# Patient Record
Sex: Female | Born: 1970
Health system: Southern US, Community
[De-identification: ages and names within clinical notes are randomized; demographics above are authoritative.]

## PROBLEM LIST (undated history)

## (undated) DIAGNOSIS — E119 Type 2 diabetes mellitus without complications: Secondary | ICD-10-CM

## (undated) DIAGNOSIS — I2510 Atherosclerotic heart disease of native coronary artery without angina pectoris: Secondary | ICD-10-CM

## (undated) DIAGNOSIS — I499 Cardiac arrhythmia, unspecified: Secondary | ICD-10-CM

## (undated) DIAGNOSIS — I10 Essential (primary) hypertension: Secondary | ICD-10-CM

## (undated) HISTORY — PX: BREAST BIOPSY: SHX20

## (undated) HISTORY — PX: CARDIAC ELECTROPHYSIOLOGY STUDY AND ABLATION: SHX1294

---

## 1999-01-19 ENCOUNTER — Emergency Department (HOSPITAL_COMMUNITY): Admission: EM | Admit: 1999-01-19 | Discharge: 1999-01-19 | Payer: Self-pay | Admitting: Emergency Medicine

## 1999-12-05 ENCOUNTER — Encounter: Admission: RE | Admit: 1999-12-05 | Discharge: 2000-03-04 | Payer: Self-pay | Admitting: Obstetrics and Gynecology

## 2000-02-13 ENCOUNTER — Inpatient Hospital Stay (HOSPITAL_COMMUNITY): Admission: AD | Admit: 2000-02-13 | Discharge: 2000-02-17 | Payer: Self-pay | Admitting: Obstetrics and Gynecology

## 2000-03-25 ENCOUNTER — Other Ambulatory Visit: Admission: RE | Admit: 2000-03-25 | Discharge: 2000-03-25 | Payer: Self-pay | Admitting: Obstetrics and Gynecology

## 2001-01-12 ENCOUNTER — Emergency Department (HOSPITAL_COMMUNITY): Admission: EM | Admit: 2001-01-12 | Discharge: 2001-01-12 | Payer: Self-pay | Admitting: *Deleted

## 2001-01-12 ENCOUNTER — Encounter: Payer: Self-pay | Admitting: Emergency Medicine

## 2001-04-29 ENCOUNTER — Other Ambulatory Visit: Admission: RE | Admit: 2001-04-29 | Discharge: 2001-04-29 | Payer: Self-pay | Admitting: Obstetrics and Gynecology

## 2002-03-01 ENCOUNTER — Ambulatory Visit: Admission: RE | Admit: 2002-03-01 | Discharge: 2002-03-01 | Payer: Self-pay | Admitting: Emergency Medicine

## 2002-03-01 ENCOUNTER — Encounter: Payer: Self-pay | Admitting: Obstetrics and Gynecology

## 2002-04-15 ENCOUNTER — Ambulatory Visit (HOSPITAL_COMMUNITY): Admission: RE | Admit: 2002-04-15 | Discharge: 2002-04-15 | Payer: Self-pay | Admitting: *Deleted

## 2002-04-15 ENCOUNTER — Encounter: Payer: Self-pay | Admitting: *Deleted

## 2002-07-04 ENCOUNTER — Emergency Department (HOSPITAL_COMMUNITY): Admission: EM | Admit: 2002-07-04 | Discharge: 2002-07-04 | Payer: Self-pay | Admitting: Emergency Medicine

## 2002-10-05 ENCOUNTER — Other Ambulatory Visit: Admission: RE | Admit: 2002-10-05 | Discharge: 2002-10-05 | Payer: Self-pay | Admitting: Obstetrics and Gynecology

## 2003-02-23 ENCOUNTER — Encounter: Admission: RE | Admit: 2003-02-23 | Discharge: 2003-02-23 | Payer: Self-pay | Admitting: Obstetrics and Gynecology

## 2003-04-30 ENCOUNTER — Encounter (INDEPENDENT_AMBULATORY_CARE_PROVIDER_SITE_OTHER): Payer: Self-pay | Admitting: Specialist

## 2003-04-30 ENCOUNTER — Inpatient Hospital Stay (HOSPITAL_COMMUNITY): Admission: RE | Admit: 2003-04-30 | Discharge: 2003-05-02 | Payer: Self-pay | Admitting: Obstetrics and Gynecology

## 2003-06-08 ENCOUNTER — Other Ambulatory Visit: Admission: RE | Admit: 2003-06-08 | Discharge: 2003-06-08 | Payer: Self-pay | Admitting: Obstetrics and Gynecology

## 2003-08-23 ENCOUNTER — Emergency Department (HOSPITAL_COMMUNITY): Admission: EM | Admit: 2003-08-23 | Discharge: 2003-08-23 | Payer: Self-pay | Admitting: *Deleted

## 2005-01-29 ENCOUNTER — Emergency Department (HOSPITAL_COMMUNITY): Admission: EM | Admit: 2005-01-29 | Discharge: 2005-01-29 | Payer: Self-pay | Admitting: Emergency Medicine

## 2006-03-29 ENCOUNTER — Emergency Department (HOSPITAL_COMMUNITY): Admission: EM | Admit: 2006-03-29 | Discharge: 2006-03-29 | Payer: Self-pay | Admitting: Emergency Medicine

## 2007-05-03 ENCOUNTER — Emergency Department (HOSPITAL_COMMUNITY): Admission: EM | Admit: 2007-05-03 | Discharge: 2007-05-03 | Payer: Self-pay | Admitting: Emergency Medicine

## 2007-05-08 ENCOUNTER — Ambulatory Visit: Payer: Self-pay | Admitting: Cardiovascular Disease

## 2007-06-02 ENCOUNTER — Ambulatory Visit: Payer: Self-pay

## 2007-06-02 ENCOUNTER — Ambulatory Visit: Payer: Self-pay | Admitting: Cardiovascular Disease

## 2007-06-02 ENCOUNTER — Ambulatory Visit: Payer: Self-pay | Admitting: Internal Medicine

## 2007-06-02 ENCOUNTER — Encounter: Payer: Self-pay | Admitting: Cardiovascular Disease

## 2007-06-02 LAB — CONVERTED CEMR LAB
BUN: 12 mg/dL (ref 6–23)
Basophils Absolute: 0.1 10*3/uL (ref 0.0–0.1)
Basophils Relative: 1.2 % — ABNORMAL HIGH (ref 0.0–1.0)
CO2: 31 meq/L (ref 19–32)
Calcium: 9 mg/dL (ref 8.4–10.5)
Chloride: 106 meq/L (ref 96–112)
Creatinine, Ser: 0.8 mg/dL (ref 0.4–1.2)
Eosinophils Absolute: 0.2 10*3/uL (ref 0.0–0.7)
Eosinophils Relative: 1.9 % (ref 0.0–5.0)
GFR calc Af Amer: 104 mL/min
GFR calc non Af Amer: 86 mL/min
Glucose, Bld: 95 mg/dL (ref 70–99)
HCT: 40.4 % (ref 36.0–46.0)
Hemoglobin: 14 g/dL (ref 12.0–15.0)
INR: 0.9 (ref 0.8–1.0)
Lymphocytes Relative: 24.4 % (ref 12.0–46.0)
MCHC: 34.7 g/dL (ref 30.0–36.0)
MCV: 89.6 fL (ref 78.0–100.0)
Monocytes Absolute: 0.9 10*3/uL (ref 0.1–1.0)
Monocytes Relative: 7.3 % (ref 3.0–12.0)
Neutro Abs: 7.6 10*3/uL (ref 1.4–7.7)
Neutrophils Relative %: 65.2 % (ref 43.0–77.0)
Platelets: 251 10*3/uL (ref 150–400)
Potassium: 3.8 meq/L (ref 3.5–5.1)
Prothrombin Time: 11.4 s (ref 10.9–13.3)
RBC: 4.51 M/uL (ref 3.87–5.11)
RDW: 12.4 % (ref 11.5–14.6)
Sodium: 140 meq/L (ref 135–145)
WBC: 11.7 10*3/uL — ABNORMAL HIGH (ref 4.5–10.5)

## 2007-06-13 ENCOUNTER — Ambulatory Visit: Payer: Self-pay | Admitting: Internal Medicine

## 2007-06-13 ENCOUNTER — Ambulatory Visit (HOSPITAL_COMMUNITY): Admission: RE | Admit: 2007-06-13 | Discharge: 2007-06-14 | Payer: Self-pay | Admitting: Internal Medicine

## 2007-07-16 ENCOUNTER — Ambulatory Visit: Payer: Self-pay | Admitting: Internal Medicine

## 2010-05-30 NOTE — Assessment & Plan Note (Signed)
[facility]                            CARDIOLOGY OFFICE NOTE   NAME:Mansour, EMY                        MRN:          [id number]  DATE:05/08/2007                            DOB:          August 02, 1970    HISTORY:  Ms. Heaps is referred by Dr. Greta Doom in the ER at  [facility].  The patient is actually a Psychologist, sport and exercise at [employer] and  knows Dr. Colon Branch.  The patient has a history of WPW.  I do not have any  records on her.  She apparently was first diagnosed at age 3.  It  sounds like she was on Pronestyl for a year, but has otherwise been  maintained on beta blockers.  Since the age of 36, she has had about 3  episodes of palpitations, most recently she had one on Saturday.  She  understands about vagal maneuvers and she cannot break her tachycardia.   She came to the emergency room.  Again, I do not have any records.  It  is not clear to me whether she got more Pronestyl, Cardizem or a beta  blocker, but she eventually slowed her heart rate down and has not had  any recurrences since Saturday.  The patient does not have syncope.  She  has significant palpitations.  There is no associated chest pain,  diaphoresis or shortness of breath.   Again, the patient has very infrequent episodes.  In this particular  episode, she was under a little stress.  She was having a birthday party  for her 69-year-old son.  However in general, these episodes appear out  of the blue and are not related to exertion.  She has never had syncope.  She has never had chest pain.  As far as I can tell, she has not had any  previous workup including echo or stress test.   REVIEW OF SYSTEMS:  Remarkable for occasional migraines.   PAST MEDICAL HISTORY:  1. Remarkable for 2 previous C. sections.  2. Borderline diabetes.  3. High blood pressure.   ALLERGIES:  CODEINE.   MEDICATIONS:  1. She takes atenolol 50 b.i.d.  2. Hydrochlorothiazide 25 a day.  3. Sertraline  50 a day.   SOCIAL HISTORY:  She is happily married.  Her husband was with her.  They seem to get along great.  He is a paramedic.  They have 2 children,  a 69-year-old and a 41-year-old.  She is otherwise not extremely active in  terms of exercise.   FAMILY HISTORY:  Both mother and father are alive without premature  coronary disease.   PHYSICAL EXAMINATION:  GENERAL:  Remarkable for a slightly overweight  white female in no distress.  VITAL SIGNS:  Weight is 229, blood pressure is 120/70, pulse is 80 and  regular, afebrile, respiratory rate 14.  HEENT:  Unremarkable.  NECK:  Carotids are normal without bruit, no lymphadenopathy,  thyromegaly or JVP elevation.  LUNGS:  Clear with diaphragmatic motion.  No wheezing.  HEART:  S1-S2 with normal heart sounds.  PMI normal.  ABDOMEN:  Benign.  Bowel sounds are positive.  No AAA, no tenderness, no  hepatosplenomegaly.  No hepatojugular reflux.  EXTREMITIES:  Distal pulses are intact, no edema.  NEURO:  Nonfocal.  SKIN:  Warm and dry.  No muscular weakness.   DIAGNOSTICS:  I do not have an EKG yet on the patient.  We initially  tried to do one in the office, but she was covered with lotion.  Dictation is being done while the tech is trying to do an EKG.   IMPRESSION:  Wolfe-Parkinson-White syndrome.  Again, I will have to look  at the patient's EKG to see where her bypass tract is.  I will refer her  to EP in [place].  I think that it is reasonable for her to undergo  an ablation so long as it is not a difficult septal pathway.   PLAN:  1. This way here, she will not have to worry about prolonged      medications.  Since her episodes are infrequent, I do not think she      needs to be on Pronestyl again.  I would continue her beta-blocker.  2. Rule out structural heart disease in the setting of WPW.  I think      it is important to make sure she does not have other structural      disease including Epstein's anomaly.  She needs a  2-D      echocardiogram which will be ordered.  3. In terms of risk stratifying her, I will do a treadmill test on her      to see when her bypass track extinguishes.  We will try arrange the      echo and stress test on the same day that she sees EP in      [place].  4. Hypertension.  Continue atenolol 50 b.i.d. and low-dose diuretic.   I spent quite a bit of time talking to the patient and her husband  regarding the diagnosis of WPW, associated symptoms and treatment  options.  She is knowledgeable being a Psychologist, sport and exercise.  She seems interested  in seeing the electrophysiology doctor.     Noralyn Pick. Eden Emms, MD, [facility]  Electronically Signed    PCN/MedQ  DD: 05/08/2007  DT: 05/08/2007  Job #: [id number]

## 2010-05-30 NOTE — Op Note (Signed)
NAME:  [name] [name], [name] [name]               ACCOUNT NO.:  [id number]   MEDICAL RECORD NO.:  [id number]          PATIENT TYPE:  OIB   LOCATION:  4739                         FACILITY:  [facility]   PHYSICIAN:  Doylene Canning. 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   INTRODUCTION:  The patient is a 40 year old woman with a longstanding  history of tachy palpitations dating back to high school.  She was  initially diagnosed with WPW syndrome and was placed on Pronestyl for  1 year, but this was discontinued.  The patient's episodes of SVT have  been fairly infrequent, but at her daughter's birth day party several  weeks ago she had an episode of very rapid heart racing, which came on  suddenly, was associated with shortness of breath and chest pressure.  Her pulse was too rapid to count according to her husband who is a  Art therapist, and in transit to the emergency room, eventually  the heart racing stopped.  The patient has been on beta-blockers for  hypertension and her tachy palpitations had been fairly infrequent, but  symptoms had been very severe and she is now referred for catheter  ablation.   PROCEDURE:  After informed was obtained, the patient was taken  diagnostic EP lab in a fasting state.  After usual preparation and  draping, intravenous fentanyl and midazolam was given for sedation.  A 6-  [place] hexapolar catheter was inserted percutaneously into the right  jugular vein and advanced to coronary sinus.  A 5-French quadripolar  catheter was inserted percutaneously in the right femoral vein and  advanced to RV apex.  A 5-0 French quadripolar catheter was inserted  percutaneously in the right femoral vein and advanced to His bundle  region.  After measurement of basic intervals,  rapid ventricular pacing  was carried out from the RV apex at 600 milliseconds and stepwise  decreased down to 290 milliseconds where VA Wenckebach was observed.  During rapid ventricular pacing, the atrial activation sequence was  midline and decremental.  Next, programmed ventricular stimulation was  carried out from the RV apex at base drive cycle length of 295  milliseconds.  The S1-S2 interval stepwise decreased down to 20  milliseconds where retrograde AV node ERP was observed.  During  programmed ventricular stimulation, there was nonsustained SVT with  midline atrial activation.  This could not be sustained, however.  Next,  programmed atrial stimulation was carried out from the coronary sinus  and the high right atrial pacing cycle length of 600 milliseconds and  stepwise decreased down to 330 milliseconds where AV Wenckebach was  observed.  During rapid atrial pacing, the PR interval was equal to but  not greater than the RR interval and there was no inducible SVT.  Next,  programmed atrial stimulation was carried out from the coronary sinus  and high right atrium base drive cycle length of  500 milliseconds.  The  S1-S2 interval was stepwise decreased from 440 milliseconds down to 280  milliseconds where the AV node ERP was observed.  During programmed  atrial stimulation, there were AH jumps and echo beats, but no inducible  SVT.  At this point, isoproterenol was infused at rates between 1 and 2  mcg per minute.  Additional rapid atrial and ventricular pacing as well  as programmed atrial and ventricular stimulation were carried out.  During programmed ventricular stimulation, there was again nonsustained  AV node reentry tachycardia cycle length of around 330-340 milliseconds.  However, these were nonsustained and despite multiple pacing attempts  could not be initiated and sustained.  At this point, we had to make a  decision about whether to proceed with a period of watchful  waiting or  whether we should proceed with a slow pathway modification of the  patient's arrhythmias.  Because she has had very symptomatic SVT in the  past despite medical therapy, the decision was made to proceed with slow  pathway modification despite the relative uncertainty that AVNRT was her  actual clinical diagnosis.  However, with the clinical history as well  as with her finding of nonsustained AVNRT jumps and echo beats.  It was  deemed most appropriate to proceed at this point.  The 7-French  quadripolar ablation catheter was inserted percutaneously into the right  femoral vein and advanced into Koch's triangle.  Koch's triangle was  displaced anteriorly.  It was somewhat larger than usual.  Initial RF  energy application delivered at the floor of Koch's triangle did not  result in junctional rhythm.  The ablation catheter was subsequently  maneuvered back to site between Koch's triangle site 5 and 6 and in this  location, junctional rhythm was demonstrated.  At this point, additional  rapid atrial and ventricular pacing and programmed atrial ventricular  stimulation were carried out and there was no additional inducible SVT,  although the patient did have intermittent echo beats present without a  jump.  At this point, the catheters were removed.  Hemostasis was  assured and the patient was returned to her room in satisfactory  condition.   COMPLICATIONS:  There were no immediate procedure complications.   RESULTS:  1. Baseline ECG.  The base ECG demonstrated sinus rhythm with normal      axis intervals.  2. Baseline intervals.  The HV interval was 35 milliseconds.  The QRS      duration was 90 milliseconds.  The sinus node cycle length was 700      milliseconds.  3. Rapid ventricular pacing.  Rapid ventricle pacing was carried out      from the RV apex demonstrating midline decremental VA conduction.      The VA Wenckebach cycle length was 290.  4. Programmed  ventricular stimulation.  Programmed ventricular      stimulation was carried out from the RV apex at base drive cycle      length of 500 milliseconds.  The S1-S2 interval stepwise decreased      down to 220 milliseconds where the retrograde AV node ERP was      observed.  During programmed ventricular stimulation, the atrial      activation was midline and decremental.  5. Rapid atrial pacing.  Rapid atrial pacing was carried out from the      coronary sinus to high right atrium stepwise decreased down to 330      milliseconds where AV Wenckebach was  observed.  During rapid atrial      pacing, the PR interval was equal to greater but not greater than      the RR interval.  There was no inducible SVT.  6. Programmed atrial stimulation.  Programmed atrial stimulation was      carried out from the coronary sinus and high right atrium base      drive cycle length of 161 as well as 400 milliseconds.  The S1-S2      interval was stepwise decreased down to 280 milliseconds where the      AV node ERP was observed.  During programmed atrial stimulation,      there were AH jumps and echo beats noted, but there were no      inducible SVT.  7. Arrhythmias observed.  Nonsustained AV node reentry tachycardia.      Initiation was with programmed ventricular stimulation.  Duration      was nonsustained.  Termination was spontaneous.  Cycle length was      variable approximately 340 milliseconds.  The QRS morphology was      left bundle.  8. Mapping.  Mapping of Koch's triangle demonstrated somewhat larger      than usual Koch's triangle with the anterior displacement.  9. RF energy application.  A total of 2 RF energy applications were      delivered between sites 5 and 9 Koch's triangle resulting      accelerated junctional rhythm.   CONCLUSIONS:  The study demonstrates apparent successful  electrophysiologic study and catheter ablation of nonsustained AV node  reentry tachycardia in a patient with  clinical history of recurrent  tachy palpitations for many years.  The clinical diagnosis was under  some question because of the nonsustained and duration of the patient's  SVT.      Doylene Canning. Ladona Ridgel, MD  Electronically Signed     GWT/MEDQ  D:  06/13/2007  T:  06/14/2007  Job:  [id number]   cc:   Nicoletta Dress. Colon Branch, M.D.

## 2010-05-30 NOTE — Assessment & Plan Note (Signed)
[facility]                         ELECTROPHYSIOLOGY OFFICE NOTE   NAME:[name] [name], [name] [name]                        MRN:          [id number]  DATE:07/16/2007                            DOB:          November 01, 1970    Ms. Done returns today for followup.  She is a very pleasant 40-year-  old with a history of hypertension who has a history of SVT and  underwent electrophysiologic study and catheter ablation of her SVT back  in May.  She returns today for followup.  She has had no significant  heart racing or other symptoms since her ablation procedure was carried  out.  She does admit to some hypertension and was initially on the  atenolol and felt a little draggy, so she was switched to  hydrochlorothiazide and lisinopril.  She had no specific complaints  today.   MEDICATIONS:  1. Hydrochlorothiazide 25 a day.  2. Lisinopril 20 a day.  3. Topamax 100 a day.  4. Sertraline 50 a day.  5. Multiple vitamins.   PHYSICAL EXAMINATION:  GENERAL:  She is a pleasant well-appearing 89-  year-old woman in no distress.  VITAL SIGNS:  Blood pressure was 115/80, the pulse 72 and regular, the  respirations were 18, the weight was 234 pounds.  NECK:  Revealed no jugular venous distention.  LUNGS:  Clear bilaterally to auscultation.  No wheezes, rales, or  rhonchi are present.  CARDIAC:  Regular rate and rhythm.  Normal S1-S2.  EXTREMITIES:  Demonstrated no edema.   EKG demonstrates sinus rhythm with normal axis intervals.   IMPRESSION:  1. Symptomatic supraventricular tachycardia, status post catheter      ablation; no recurrent symptoms.  2. Hypertension, well controlled on combination of lisinopril and      hydrochlorothiazide.  3. Obesity.  I have discussed the importance of weight loss.   DISCUSSION:  Ms. Milewski is otherwise stable.  We will plan to see the  patient back in the office on a p.r.n. basis.     Doylene Canning. Ladona Ridgel, MD  Electronically  Signed   GWT/MedQ  DD: 07/16/2007  DT: 07/16/2007  Job #: [id number]   cc:   Nicoletta Dress. Colon Branch, M.D.

## 2010-05-30 NOTE — Assessment & Plan Note (Signed)
HEALTHCARE                         ELECTROPHYSIOLOGY OFFICE NOTE   NAME:[name] [name]                        MRN:          [id number]  DATE:06/02/2007                            DOB:          12/29/1970    Ms. [name] [name] is referred today by Dr. Charlton Haws for evaluation of  SVT.  The patient is very pleasant 40 year old woman.  She works as a  Academic librarian at [employer].  She has a history of  hypertension.  She states that at age 37 she was diagnosed with WPW  syndrome by a cardiologist in [place].  I do not know anything more about  this, and she does not know the doctor's name.  Apparently, she was  initially placed on Pronestyl but then switch to beta blockers and has  been fairly stable.  Since then, she has had 3 to 4 long episodes of SVT  requiring medical attention.  She also has other episodes which are very  infrequent and short lived.  The patient was with her daughter at her  daughter's birthday party several weeks ago when she went into SVT.  This persisted for over an hour.  The pulse was not palpable by her  husband who works as an Museum/gallery exhibitions officer.  She tried vagal maneuvers, and after over  an hour, the SVT eventually but then returned, and she subsequently went  to the emergency department were it stopped after that.  This was  despite taking her atenolol 50 mg twice a day.  The patient is now  referred for additional evaluation.  She denies any frank syncope.   FAMILY HISTORY:  Is negative for premature coronary disease.   SOCIAL HISTORY:  Is notable for her being married.  She denies tobacco  or ethanol abuse.   REVIEW OF SYSTEMS:  Is notable for palpitations and chest pressure when  she goes into SVT.  Otherwise, she has very mild arthritis and no other  pertinent findings on review of systems.   PHYSICAL EXAMINATION:  She is pleasant, obese, 40 year old woman in no  acute distress.  The blood pressure was 130/86, the pulse  96 and  regular, respirations were 18.  Weight was 235 pounds.  HEENT EXAM:  Was normocephalic and atraumatic.  Pupils equal and round.  Oropharynx moist.  Sclerae anicteric.  NECK:  Revealed no jugular venous distention.  There was no thyromegaly.  Trachea was midline.  The carotids were 2+ symmetric.  LUNGS:  Clear bilaterally to auscultation.  No wheezes, rales or rhonchi  were present.  There was no increased work of breathing.  CARDIOVASCULAR EXAM:  Revealed a regular rate and rhythm.  Normal S1-S2.  No murmurs, rubs or gallops appreciated.  The PMI was not enlarged nor  was it laterally displaced.  ABDOMINAL EXAM:  Was obese, nontender, nondistended.  There was no  organomegaly.  EXTREMITIES:  Demonstrated no cyanosis, clubbing or edema.  Pulses were  2+ and symmetric.  NEUROLOGIC EXAM:  Alert and oriented x3 with cranial nerves intact.  Strength was 5/5 and symmetric.   EKG from one  month  ago demonstrates sinus rhythm.  There was no  evidence of any ventricular pre-excitation on careful review of the EKG.  The PR interval was slightly shortened, however.   IMPRESSION:  1. Recurrent supraventricular tachycardia, possibly secondary to [employer]-      Parkinson-White syndrome, though there is no clear-cut diagnosis of      this.  This SVT os despite medical therapy.  2. Hypertension.  3. Obesity.   DISCUSSION:  I have discussed treatment options with the patient.  The  risks, benefits, goals, and expectations of electrophysiologic study and  catheter ablation of her SVT have been discussed, and she would like to  proceed.  This will be scheduled at earliest possible convenient time.     [name] Canning. Ladona Ridgel, MD  Electronically Signed    GWT/MedQ  DD: 06/02/2007  DT: 06/02/2007  Job #: [id number]   cc:   [name] [name]. [name] [name], M.D.

## 2010-05-30 NOTE — Procedures (Signed)
[facility]                              EXERCISE TREADMILL   NAME:[name] [name], [name] [name]                        MRN:          [id number]  DATE:06/02/2007                            DOB:          25-Jun-1970    INDICATIONS:  WPW.   The patient exercised for over 8 minutes on the same Bruce protocol and  stopped due to fatigue.  Maximum heart rate was 181,  peak blood  pressure is 135/74.   At baseline the patient had a short PR interval of 120 milliseconds.  There was minimal pre-excitation primarily seen in lead V4 with stress.  There was no change in morphology, no evidence of arrhythmia.   IMPRESSION:  Low risk stress test with minimal pre-excitation at rest,  no change in morphology with exercise up to heart rate of 181, no  evidence of arrhythmia.   The patient will follow with Dr. Sharrell Ku later today.     Noralyn Pick. Eden Emms, MD, [facility]  Electronically Signed    PCN/MedQ  DD: 06/02/2007  DT: 06/02/2007  Job #: [id number]

## 2010-05-30 NOTE — Discharge Summary (Signed)
NAME:  [name] [name], [name] [name]               ACCOUNT NO.:  [id number]   MEDICAL RECORD NO.:  [id number]          PATIENT TYPE:  OIB   LOCATION:  4739                         FACILITY:  [facility]   PHYSICIAN:  Doylene Canning. Ladona Ridgel, MD    DATE OF BIRTH:  January 04, 1971   DATE OF ADMISSION:  06/13/2007  DATE OF DISCHARGE:                               DISCHARGE SUMMARY   ALLERGIES:  The patient has food allergies.  Codeine gives hives.  Also  coconut and oranges make the patient break out and swell.   Time of this dictation and examination greater than 35 minutes.   FINAL DIAGNOSES:  1. 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  The patient had a small Koch's triangle.  2. Palpitations, increasing in frequency and duration.   SECONDARY DIAGNOSES:  1. Hypertension.  2. Treadmill study on Jun 02, 2007, minimal preexcitation noted.  3. Echocardiogram on Jun 02, 2007, preserved ejection fraction.  No      left ventricular wall motion abnormalities.   PROCEDURE:  Electrophysiology study, radiofrequency catheter ablation of  atrial ventricular nodal reentry tachycardia, Dr. Lewayne Bunting.  The  patient maintained sinus rhythm postoperatively.   BRIEF HISTORY:  [name] [name] is a 40 year old female.  She has a history  of hypertension.  She also has a history of tachy palpitations.   She was first diagnosed at age 30 and told that she had WPW.  Initially,  which she was placed on Pronestyl but then switched to beta blockers.  She has been fairly stable.   Lately, she has had 3 to 4 episodes of SVT.  She has had to seek medical  attention.  Some other episodes are infrequent and short-lived.  The  patient is unable to stop them with vagal maneuvers and they cause  palpitations and but no frank syncope and no chest pain.  The patient  was referred to electrophysiology for further evaluation.   The  electrocardiogram carefully investigated showing no evidence of  ventricular preexcitation.  The PR interval is slightly shortened.   The risks and benefits of catheter ablation and electrophysiology study  have been discussed with the patient.  She wishes to proceed.   HOSPITAL COURSE:  The patient presents electively on Jun 13, 2007.  She  underwent electrophysiology study.  The study uncovered AV nodal  reentry.  This was ablated with 2 radiofrequency applications to the  slow pathway.  The patient had a very small Koch's triangle.   DISCHARGE MEDICATIONS:  1. The patient's atenolol can be weaned slowly from 50 mg twice daily      to 25 mg twice daily and then stopped at follow up office visit.      The patient discharging on Jun 14, 2007, on atenolol doses.  2. Hydrochlorothiazide 25 mg daily.  3. Sertraline 50 mg daily .  4. Topamax 100 mg daily.  5. Vitamin B12 daily.  6. Multivitamin daily.   She sees Dr. Ladona Ridgel Wednesday on July 1  at 10:30.   LABORATORY STUDIES:  Pertinent to this admission drawn on Jun 02, 2007,  white cells 11.7, hemoglobin 14, hematocrit 40.4, and  platelets are to  251.  Protime 11.4, INR 0.9, sodium 140, potassium 3.8, chloride 106,  carbonate 31, glucose 95.  BUN is 12 and creatinine 0.8.      Maple Mirza, PA      Doylene Canning. Ladona Ridgel, MD  Electronically Signed    GM/MEDQ  D:  06/13/2007  T:  06/14/2007  Job:  [id number]   cc:   Doylene Canning. Ladona Ridgel, MD  Noralyn Pick Eden Emms, MD, [facility]  Nicoletta Dress. Colon Branch, M.D.

## 2010-06-02 NOTE — Op Note (Signed)
NAME:  [name] [name], [name] [name]                           ACCOUNT NO.:  [id number]   MEDICAL RECORD NO.:  [id number]                   PATIENT TYPE:  INP   LOCATION:  NA                                   FACILITY:  [facility]   PHYSICIAN:  Dineen Kid. Rana Snare, M.D.                 DATE OF BIRTH:  October 05, 1970   DATE OF PROCEDURE:  04/30/2003  DATE OF DISCHARGE:                                 OPERATIVE REPORT   PREOPERATIVE DIAGNOSES:  1. Intrauterine pregnancy at 39 weeks.  2. Previous cesarean section, for repeat.  3. Desired sterility.  4. Gestational diabetes requiring insulin.  5. Chronic hypertension.   POSTOPERATIVE DIAGNOSES:  1. Intrauterine pregnancy at 39 weeks.  2. Previous cesarean section, for repeat.  3. Desired sterility.  4. Gestational diabetes requiring insulin.  5. Chronic hypertension.   PROCEDURES:  1. Low segment transverse cesarean section.  2. [place] bilateral tubal ligation.   SURGEON:  Dineen Kid. Rana Snare, M.D.   ASSISTANT:  Duke Salvia. Marcelle Overlie, M.D.   ANESTHESIA:  Epidural.   ESTIMATED BLOOD LOSS:  800 mL.   INDICATIONS:  Ms. Greenwell is a 40 year old G2, P1, at 39 weeks, previous  cesarean section for cephalopelvic disproportion.  She presents for repeat  cesarean section and tubal ligation due to desired sterility.  Her pregnancy  was complicated by chronic hypertension, Wolff-Parkinson-White syndrome  which is asymptomatic, and also diabetes requiring insulin.  Risks and  benefits were discussed at length.  Informed consent was obtained was  obtained, including five out of 1000 tubal ligation failure.  She gives her  informed consent.   FINDINGS AT THE TIME OF SURGERY:  A viable female infant, Apgars are 8 and  9, pH arterial 7.34.   DESCRIPTION OF PROCEDURE:  After adequate analgesia in the supine position  with a left lateral tilt, she was sterilely prepped and draped, the bladder  was sterilely drained with a Foley catheter.  A Pfannenstiel skin incision  was  made two fingerbreadths above the pubic symphysis, taken down sharply to  the fascia, which was incised transversely and extended superiorly and  inferiorly off the bellies of the rectus muscle, which were separated  sharply in the midline.  The peritoneum was entered sharply.  Due to some  discomfort, 10 mL of 0.25% Marcaine was placed in the intraperitoneal  cavity.  A bladder flap was created and placed behind the bladder blade.  A  low segment myotomy incision was made down to the infant's vertex, extended  laterally with the operator's fingertips.  The vertex was then delivered,  nares and pharynx suctioned.  The infant was then delivered, cord clamped  and cut, handed to the pediatricians for resuscitation.  Cord blood was then  obtained, placenta extracted manually.  The uterus was exteriorized, wiped  clean with a dry lap.  The myotomy incision was closed in two layers, the  first being  a running locking layer of 0 Monocryl suture, the second being  an imbricating layer of 0 Monocryl suture with good approximation and good  hemostasis.  The right fallopian tube was identified by the fimbriated end  and the midportion of the tube is grasped with a Babcock clamp.  Two sutures  were placed of 0 plain through the mesosalpinx, doubly ligated, central  portion excised, and the proximal section was ligated with a 2-0 silk.  The  left fallopian tube was identified by the fimbriated end.  Two sutures of 0  plain were passed through the mesosalpinx, doubly ligated, central portion  excised, and the proximal section was then ligated with 2-0 silk.  The tubal  ostia were made hemostatic with Bovie cautery.  The uterus was replaced back  in the peritoneal cavity and after a copious amount of irrigation, adequate  hemostasis was assured.  The peritoneum is then closed with 0 Monocryl, the  rectus muscle plicated in the midline, irrigation is applied, and after  adequate hemostasis the fascia was  closed in a single layer of 0 PDS in  running fashion.  Irrigation was once again applied and after adequate  hemostasis, the skin was stapled and Steri-Strips applied.  The patient  tolerated the procedure well, was stable on transfer to the recovery room.  The sponge, needle, and instrument count was normal x3.  Estimated blood  loss 800 mL.  The patient received 1 g of Rocephin after delivery of the  placenta.                                               Dineen Kid Rana Snare, M.D.    DCL/MEDQ  D:  04/30/2003  T:  04/30/2003  Job:  [id number]

## 2010-06-02 NOTE — Discharge Summary (Signed)
[name] [name], [name] [name]                           ACCOUNT NO.:  [id number]   MEDICAL RECORD NO.:  [id number]                   PATIENT TYPE:  INP   LOCATION:  9146                                 FACILITY:  [facility]   PHYSICIAN:  Tracie Harrier, M.D.              DATE OF BIRTH:  06-07-1970   DATE OF ADMISSION:  04/30/2003  DATE OF DISCHARGE:  05/02/2003                                 DISCHARGE SUMMARY   ADMISSION DIAGNOSES:  1. Intrauterine pregnancy at 29 weeks estimated gestational age.  2. Previous cesarean section, for repeat.  3. Multiparity, desires sterility.  4. Gestational diabetes requiring insulin.  5. Chronic hypertension.   DISCHARGE DIAGNOSES:  1. Status post low transverse cesarean section.  2. A viable female infant.  3. Permanent sterilization.   PROCEDURE:  1. Repeat low transverse cesarean section.  2. [place] bilateral tubal ligation.   REASON FOR ADMISSION:  Please see dictated H&P.   HOSPITAL COURSE:  The patient was a 40 year old gravida 2, para 1 at 41  weeks estimated gestational age that was scheduled for a cesarean section.  The patient also had desired sterility.  On the morning of admission, the  patient was taken to the operating room where epidural was placed without  difficulty.  A low transverse incision was made with the delivery of a  viable female infant weighing 7 pounds 13 ounces with Apgars of 8 at one  minute and 9 at five minutes.  Umbilical cord pH was 7.34.  Bilateral tubal  ligation was performed without difficulty.  The patient was then transferred  to the recovery room in stable condition.  On postoperative day #1, the  patient was doing well.  She was without complaint, vital signs were stable,  she was afebrile, abdomen was soft, fundus was firm and nontender.  Abdominal dressing was clean, dry, and intact.  Labs revealed hemoglobin of  10.9.  On postoperative day #2, the patient was doing well.  She was  tolerating a regular diet  without complaints of nausea or vomiting, vital  signs were stable, she was afebrile.  Abdominal dressing had been removed  revealing an incision that was clean, dry, and intact.  The patient desired  early discharge and instructions were given and the patient was discharged  home.   CONDITION ON DISCHARGE:  Good.   DIET:  Regular as tolerated.   ACTIVITY:  No heavy lifting, no driving x2 weeks, no vaginal entry.   FOLLOW UP:  The patient is to follow up in the office in 2 to 3 days for a  staple removal.  She is to call for a temperature greater than 100 degrees,  persistent nausea and vomiting, heavy vaginal bleeding, and/or redness or  drainage from the incisional site.   DISCHARGE MEDICATIONS:  1. Percocet 5/325 mg, dispense #40, one 4-6 hours p.r.n. pain.  2. Motrin 600 mg every 6 hours p.r.n.  3. Prenatal vitamins one p.o. daily.  4. Insulin per pump as directed.     Julio Sicks, N.P.                        Tracie Harrier, M.D.    CC/MEDQ  D:  05/13/2003  T:  05/13/2003  Job:  [id number]

## 2010-06-02 NOTE — H&P (Signed)
NAME:  [name] [name], [name] [name]                           ACCOUNT NO.:  [id number]   MEDICAL RECORD NO.:  [id number]                   PATIENT TYPE:  INP   LOCATION:  NA                                   FACILITY:  [facility]   PHYSICIAN:  Dineen Kid. Rana Snare, M.D.                 DATE OF BIRTH:  1970-12-27   DATE OF ADMISSION:  04/30/2003  DATE OF DISCHARGE:                                HISTORY & PHYSICAL   HISTORY OF PRESENT ILLNESS:  [name] [name] is a 40 year old G2, P1 at [redacted]  weeks gestational age who presents for repeat cesarean section. Her  estimated date of confinement is May 05, 2003. The pregnancy has been  complicated by gestational diabetes requiring insulin. Also chronic  hypertension, on Aldomet and Atenolol. She does have a history of Evelene Croon-  Parkinson-White syndrome, which is asymptomatic and she has a history of  previous cesarean section for cephalopelvic disproportion. Desires repeat  cesarean section as well as tubal ligation.   PAST MEDICAL HISTORY:  Significant for chronic hypertension, Wolff-Parkinson-  White syndrome, and history of migraines.   PAST SURGICAL HISTORY:  Cesarean section for cephalopelvic disproportion.   MEDICATIONS:  Atenolol 50 mg at bedtime and Aldomet 500 mg t.i.d.   ALLERGIES:  CODEINE AND VICODIN (rash).   PHYSICAL EXAMINATION:  VITAL SIGNS:  Blood pressure 130/90.  HEART:  Regular rate and rhythm.  LUNGS:  Clear to auscultation bilaterally.  ABDOMEN:  Gravid, nontender.  GENITALIA:  Cervix is closed, thick, and high.   IMPRESSION:  Intrauterine pregnancy at 39 weeks. Previous cesarean section,  desires repeat cesarean section. Also desires sterility.   PLAN:  Risks and benefits were discussed at length which include, but not  limited to, risk of infection; bleeding; damage to the uterus, tubes,  ovaries, bowel, bladder or fetus. The patient understands the risk of tubal  failure rate is 5 out of 1,000. She does give her informed consent and  wishes  to proceed. Her CODEINE and VICODIN allergies are a rash.                                               Dineen Kid Rana Snare, M.D.    DCL/MEDQ  D:  04/29/2003  T:  04/29/2003  Job:  [id number]

## 2010-06-02 NOTE — Op Note (Signed)
[facility]  Patient:    [name] [name], [name] [name]                            MRN: [id number] Proc. Date: 02/14/00 Adm. Date:  [id number] Attending:  Frederich Balding                           Operative Report  PREOPERATIVE DIAGNOSIS:       Intrauterine pregnancy at 38 weeks with cephalopelvic disproportion.  POSTOPERATIVE DIAGNOSES:      1. Intrauterine pregnancy at 38 weeks with                                  cephalopelvic disproportion.                               2. Meconium.  PROCEDURE:                    Primary low transverse cesarean section.  SURGEON:                      Trevor Iha, M.D.  ANESTHESIA:                   Spinal.  ESTIMATED BLOOD LOSS:         800 cc.  INDICATIONS:                  The patient is a 40 year old G1, P0 whose pregnancy was complicated by gestational diabetes and chronic hypertension, who was admitted for induction of labor due to labile blood pressures and term pregnancy.  She underwent Cytotec induction and cervical ripening.  She progressed to 1 cm completely effaced.  However, the infants vertex never descended into the pelvis.  After 4-6 hours of Pitocin despite adequate contractions, the fetal vertex never descended into the pelvis.  Because of this, we proceed with primary low transverse cesarean section for cephalopelvic disproportion.  FINDINGS AT THE TIME OF SURGERY:              Viable female infant.  Apgars were 8 and 9. Cord pH is currently pending.  The weight was 8 lb 0 oz.  Light meconium was noted at the time of delivery.  DESCRIPTION OF PROCEDURE:     After adequate analgesia, the patient was placed in the supine position with left lateral tilt.  She was sterilely prepped and draped.  A Foley catheter was sterilely placed.  A Pfannenstiel skin incision was made two fingerbreadths above the pubic symphysis, where it was taken down sharply to the fascia, which was incised transversely and  then superiorly and inferiorly down to the belly of the rectus muscle.  They were separated sharply in the midline.  The peritoneum was entered sharply.  A bladder blade was placed.  The uterine serosa was elevated, nicked and incised transversely. A bladder flap was created and placed behind the bladder blade. A low cervical myotomy incision was made down to the amniotic sac.  Amniotomy was performed, revealing light meconium.  The infants vertex was then delivered atraumatically.  The nares and pharynx were then DeLee suctioned.  The infant was then delivered.  The cord was clamped.  The infant was handed to the  waiting pediatricians.  Good cry was noted.  Apgars were 8 and 9.  It was a female infant.  Cord blood was then obtained.  The placenta was extracted manually.  The uterus was exteriorized, wiped clean with a dry lap and closed in two layers, the first being a running locking layer of 0 Monocryl, the second being an imbricating later of 0 Monocryl, with good approximation and good hemostasis achieved.  Normal tubes and ovaries were noted as well as the uterus.  The uterus was placed back in the peritoneal cavity.  After a copious amount of irrigation and adequate hemostasis was assured, the peritoneum was closed with 0 Monocryl.  The fascia was then closed with 0 Panacryl and two sutures of 0 Monocryl in a running fasion, with good approximation and good hemostasis.  Irrigation was again done.  After adequate hemostasis, the skin was closed with staples and Steri-Strips applied.  The patient tolerated the procedure well and was stable on transfer to the recovery room.  Sponge and instrument count was normal x 3.  The patient received 1 g of cefotetan after delivery of the placenta.  Estimated blood loss for the procedure was 800 cc. DD:  02/14/00 TD:  02/14/00 Job: [id number]

## 2010-10-10 LAB — POCT I-STAT, CHEM 8
BUN: 15
Creatinine, Ser: 0.9
Hemoglobin: 13.9
Potassium: 3.5
Sodium: 135

## 2010-10-10 LAB — POCT CARDIAC MARKERS
CKMB, poc: <1 — ABNORMAL LOW
Myoglobin, poc: 63.2
Operator id: 282261
Troponin i, poc: <0.05

## 2010-10-11 LAB — BASIC METABOLIC PANEL
CO2: 28
Calcium: 8.5
GFR calc Af Amer: >60
GFR calc non Af Amer: >60
Glucose, Bld: 129 — ABNORMAL HIGH
Potassium: 3.4 — ABNORMAL LOW
Sodium: 139

## 2010-10-11 LAB — PROTIME-INR: INR: 0.9

## 2010-10-11 LAB — CBC
HCT: 38.3
Hemoglobin: 13.8
RBC: 4.28
RDW: 13

## 2010-10-11 LAB — APTT: aPTT: 32

## 2013-10-01 ENCOUNTER — Encounter (HOSPITAL_COMMUNITY): Payer: Self-pay | Admitting: Emergency Medicine

## 2013-10-01 ENCOUNTER — Emergency Department (HOSPITAL_COMMUNITY)
Admission: EM | Admit: 2013-10-01 | Discharge: 2013-10-01 | Disposition: A | Discharge status: Home / Self Care | Payer: 59 | Attending: Emergency Medicine | Admitting: Emergency Medicine

## 2013-10-01 ENCOUNTER — Emergency Department (HOSPITAL_COMMUNITY): Payer: 59

## 2013-10-01 DIAGNOSIS — I10 Essential (primary) hypertension: Secondary | ICD-10-CM | POA: Diagnosis not present

## 2013-10-01 DIAGNOSIS — Y9389 Activity, other specified: Secondary | ICD-10-CM | POA: Diagnosis not present

## 2013-10-01 DIAGNOSIS — S99919A Unspecified injury of unspecified ankle, initial encounter: Secondary | ICD-10-CM | POA: Diagnosis present

## 2013-10-01 DIAGNOSIS — S8251XA Displaced fracture of medial malleolus of right tibia, initial encounter for closed fracture: Secondary | ICD-10-CM

## 2013-10-01 DIAGNOSIS — S8990XA Unspecified injury of unspecified lower leg, initial encounter: Secondary | ICD-10-CM | POA: Diagnosis present

## 2013-10-01 DIAGNOSIS — R296 Repeated falls: Secondary | ICD-10-CM | POA: Insufficient documentation

## 2013-10-01 DIAGNOSIS — S8253XA Displaced fracture of medial malleolus of unspecified tibia, initial encounter for closed fracture: Secondary | ICD-10-CM | POA: Diagnosis not present

## 2013-10-01 DIAGNOSIS — I2510 Atherosclerotic heart disease of native coronary artery without angina pectoris: Secondary | ICD-10-CM | POA: Diagnosis not present

## 2013-10-01 DIAGNOSIS — S99929A Unspecified injury of unspecified foot, initial encounter: Secondary | ICD-10-CM

## 2013-10-01 DIAGNOSIS — Y9289 Other specified places as the place of occurrence of the external cause: Secondary | ICD-10-CM | POA: Insufficient documentation

## 2013-10-01 HISTORY — DX: Essential (primary) hypertension: I10

## 2013-10-01 HISTORY — DX: Cardiac arrhythmia, unspecified: I49.9

## 2013-10-01 HISTORY — DX: Atherosclerotic heart disease of native coronary artery without angina pectoris: I25.10

## 2013-10-01 MED ORDER — PROMETHAZINE HCL 12.5 MG PO TABS: 12.5000 mg | ORAL_TABLET | Freq: Four times a day (QID) | ORAL | Status: AC | PRN

## 2013-10-01 MED ORDER — OXYCODONE-ACETAMINOPHEN 5-325 MG PO TABS: 1.0000 | ORAL_TABLET | ORAL | Status: AC | PRN

## 2013-10-01 MED ORDER — PROMETHAZINE HCL 25 MG/ML IJ SOLN
25.0000 mg | Freq: Once | INTRAMUSCULAR | Status: AC
Start: 2013-10-01 — End: 2013-10-01
  Administered 2013-10-01: 25 mg via INTRAMUSCULAR
  Filled 2013-10-01: qty 1

## 2013-10-01 MED ORDER — HYDROMORPHONE HCL 1 MG/ML IJ SOLN
1.0000 mg | Freq: Once | INTRAMUSCULAR | Status: AC
  Administered 2013-10-01: 1 mg via INTRAMUSCULAR
  Filled 2013-10-01: qty 1

## 2013-10-01 MED ORDER — OXYCODONE-ACETAMINOPHEN 5-325 MG PO TABS
1.0000 | ORAL_TABLET | Freq: Once | ORAL | Status: AC
  Administered 2013-10-01: 1 via ORAL
  Filled 2013-10-01: qty 1

## 2013-10-01 NOTE — ED Notes (Signed)
Pt fell after stepping in hole approx 30 mins ago. Right ankle/right foot swollen. Warm, Pt able to wiggle toes, sensation in tact.

## 2013-10-01 NOTE — ED Provider Notes (Signed)
Seen and evaluated. History reviewed. Inverted, then everted her ankle. Complains of pain medially. Complains of pain and swelling laterally.  Tender palpation medial malleolus, and inferior to the lateral malleolus. Soft tissue swelling noted inferior to the lateral malleolus. Nontender over the fifth. Nontender over the forefoot. Nontender over the proximal fibula or tibia. X-rays show small osteophyte fracture of the tip of the medial malleolus. Posterior malleolus, and lateral malleolus appear intact. The foot is intact. Placed in posterior splint. Reexamined after he was placed. It is properly positioned. She requests orthopedic referral in [place]. Plan is nonweightbearing. Orthopedic followup. Diagnosis is medial malleolus fracture, probable anterior and inferior talofibular ligament tear.  Rolland Porter, MD 10/01/13 2005

## 2013-10-01 NOTE — ED Notes (Signed)
Discharge instructions reviewed with patient including need to see orthopedic surgeon as soon as possible and for patient to not return to work or weight bear on right ankle; pt verbalized understanding all all instructions. No crutches given patient stated she has a pair at home.

## 2013-10-01 NOTE — Discharge Instructions (Signed)
Cast or Splint Care °Casts and splints support injured limbs and keep bones from moving while they heal. It is important to care for your cast or splint at home.   °HOME CARE INSTRUCTIONS °· Keep the cast or splint uncovered during the drying period. It can take 24 to 48 hours to dry if it is made of plaster. A fiberglass cast will dry in less than 1 hour. °· Do not rest the cast on anything harder than a pillow for the first 24 hours. °· Do not put weight on your injured limb or apply pressure to the cast until your health care provider gives you permission. °· Keep the cast or splint dry. Wet casts or splints can lose their shape and may not support the limb as well. A wet cast that has lost its shape can also create harmful pressure on your skin when it dries. Also, wet skin can become infected. °¨ Cover the cast or splint with a plastic bag when bathing or when out in the rain or snow. If the cast is on the trunk of the body, take sponge baths until the cast is removed. °¨ If your cast does become wet, dry it with a towel or a blow dryer on the cool setting only. °· Keep your cast or splint clean. Soiled casts may be wiped with a moistened cloth. °· Do not place any hard or soft foreign objects under your cast or splint, such as cotton, toilet paper, lotion, or powder. °· Do not try to scratch the skin under the cast with any object. The object could get stuck inside the cast. Also, scratching could lead to an infection. If itching is a problem, use a blow dryer on a cool setting to relieve discomfort. °· Do not trim or cut your cast or remove padding from inside of it. °· Exercise all joints next to the injury that are not immobilized by the cast or splint. For example, if you have a long leg cast, exercise the hip joint and toes. If you have an arm cast or splint, exercise the shoulder, elbow, thumb, and fingers. °· Elevate your injured arm or leg on 1 or 2 pillows for the first 1 to 3 days to decrease  swelling and pain. It is best if you can comfortably elevate your cast so it is higher than your heart. °SEEK MEDICAL CARE IF:  °· Your cast or splint cracks. °· Your cast or splint is too tight or too loose. °· You have unbearable itching inside the cast. °· Your cast becomes wet or develops a soft spot or area. °· You have a bad smell coming from inside your cast. °· You get an object stuck under your cast. °· Your skin around the cast becomes red or raw. °· You have new pain or worsening pain after the cast has been applied. °SEEK IMMEDIATE MEDICAL CARE IF:  °· You have fluid leaking through the cast. °· You are unable to move your fingers or toes. °· You have discolored (blue or white), cool, painful, or very swollen fingers or toes beyond the cast. °· You have tingling or numbness around the injured area. °· You have severe pain or pressure under the cast. °· You have any difficulty with your breathing or have shortness of breath. °· You have chest pain. °Document Released: 12/30/1999 Document Revised: 10/22/2012 Document Reviewed: 07/10/2012 °Exit[employer]® Patient Information ©2015 [employer]. This information is not intended to replace advice given to you by your health care   provider. Make sure you discuss any questions you have with your health care provider. °Ankle Fracture °A fracture is a break in a bone. The ankle joint is made up of three bones. These include the lower (distal) sections of your lower leg bones, called the tibia and fibula, along with a bone in your foot, called the talus. Depending on how bad the break is and if more than one ankle joint bone is broken, a cast or splint is used to protect and keep your injured bone from moving while it heals. Sometimes, surgery is required to help the fracture heal properly.  °There are two general types of fractures: °· Stable fracture. This includes a single fracture line through one bone, with no injury to ankle ligaments. A fracture of the talus that  does not have any displacement (movement of the bone on either side of the fracture line) is also stable. °· Unstable fracture. This includes more than one fracture line through one or more bones in the ankle joint. It also includes fractures that have displacement of the bone on either side of the fracture line. °CAUSES °· A direct blow to the ankle.   °· Quickly and severely twisting your ankle. °· Trauma, such as a car accident or falling from a significant height. °RISK FACTORS °You may be at a higher risk of ankle fracture if: °· You have certain medical conditions. °· You are involved in high-impact sports. °· You are involved in a high-impact car accident. °SIGNS AND SYMPTOMS  °· Tender and swollen ankle. °· Bruising around the injured ankle. °· Pain on movement of the ankle. °· Difficulty walking or putting weight on the ankle. °· A cold foot below the site of the ankle injury. This can occur if the blood vessels passing through your injured ankle were also damaged. °· Numbness in the foot below the site of the ankle injury. °DIAGNOSIS  °An ankle fracture is usually diagnosed with a physical exam and X-rays. A CT scan may also be required for complex fractures. °TREATMENT  °Stable fractures are treated with a cast or splint and using crutches to avoid putting weight on your injured ankle. This is followed by an ankle strengthening program. Some patients require a special type of cast, depending on other medical problems they may have. Unstable fractures require surgery to ensure the bones heal properly. Your health care provider will tell you what type of fracture you have and the best treatment for your condition. °HOME CARE INSTRUCTIONS  °· Review correct crutch use with your health care provider and use your crutches as directed. Safe use of crutches is extremely important. Misuse of crutches can cause you to fall or cause injury to nerves in your hands or armpits. °· Do not put weight or pressure on the  injured ankle until directed by your health care provider. °· To lessen the swelling, keep the injured leg elevated while sitting or lying down. °· Apply ice to the injured area: °¨ Put ice in a plastic bag. °¨ Place a towel between your cast and the bag. °¨ Leave the ice on for 20 minutes, 2-3 times a day. °· If you have a plaster or fiberglass cast: °¨ Do not try to scratch the skin under the cast with any objects. This can increase your risk of skin infection. °¨ Check the skin around the cast every day. You may put lotion on any red or sore areas. °¨ Keep your cast dry and clean. °· If you have   a plaster splint: °¨ Wear the splint as directed. °¨ You may loosen the elastic around the splint if your toes become numb, tingle, or turn cold or blue. °· Do not put pressure on any part of your cast or splint; it may break. Rest your cast only on a pillow the first 24 hours until it is fully hardened. °· Your cast or splint can be protected during bathing with a plastic bag sealed to your skin with medical tape. Do not lower the cast or splint into water. °· Take medicines as directed by your health care provider. Only take over-the-counter or prescription medicines for pain, discomfort, or fever as directed by your health care provider. °· Do not drive a vehicle until your health care provider specifically tells you it is safe to do so. °· If your health care provider has given you a follow-up appointment, it is very important to keep that appointment. Not keeping the appointment could result in a chronic or permanent injury, pain, and disability. If you have any problem keeping the appointment, call the facility for assistance. °SEEK MEDICAL CARE IF: °You develop increased swelling or discomfort. °SEEK IMMEDIATE MEDICAL CARE IF:  °· Your cast gets damaged or breaks. °· You have continued severe pain. °· You develop new pain or swelling after the cast was put on. °· Your skin or toenails below the injury turn blue or  gray. °· Your skin or toenails below the injury feel cold, numb, or have loss of sensitivity to touch. °· There is a bad smell or pus draining from under the cast. °MAKE SURE YOU:  °· Understand these instructions. °· Will watch your condition. °· Will get help right away if you are not doing well or get worse. °Document Released: 12/30/1999 Document Revised: 01/06/2013 Document Reviewed: 07/31/2012 °ExitCare® Patient Information ©2015 [employer]. This information is not intended to replace advice given to you by your health care provider. Make sure you discuss any questions you have with your health care provider. ° °

## 2013-10-01 NOTE — ED Provider Notes (Signed)
CSN: [id number]     Arrival date & time 10/01/13  1842 History   None    Chief Complaint  Patient presents with  . Ankle Pain     (Consider location/radiation/quality/duration/timing/severity/associated sxs/prior Treatment) Patient is a 43 y.o. female presenting with ankle pain. The history is provided by the patient.  Ankle Pain Location:  Ankle Injury: yes   Ankle location:  R ankle Pain details:    Quality:  Shooting and throbbing   Radiates to:  Does not radiate   Severity:  Severe   Onset quality:  Sudden   Duration: 30 minutes prior to arrival.   Timing:  Constant   Progression:  Worsening Chronicity:  New Dislocation: no   Foreign body present:  No foreign bodies Prior injury to area:  No Relieved by:  Nothing Worsened by:  Bearing weight Ineffective treatments:  None tried Associated symptoms: decreased ROM, swelling and tingling    [name] [name] is a 43 y.o. female who presents to the ED with right ankle pain. She states that she was walking and stepped in a hole and turned her ankle. There is severe swelling and pain to the lateral aspect.   Past Medical History  Diagnosis Date  . Coronary artery disease   . Supraventricular arrhythmia   . Hypertension    History reviewed. No pertinent past surgical history. History reviewed. No pertinent family history. History  Substance Use Topics  . Smoking status: Never Smoker   . Smokeless tobacco: Not on file  . Alcohol Use: No   OB History   Grav Para Term Preterm Abortions TAB SAB Ect Mult Living                 Review of Systems  Musculoskeletal:       Right ankle pain and swelling  all other systems negative    Allergies  Codeine  Home Medications   Prior to Admission medications   Not on File  BP 129/78  Pulse 115  Temp(Src) 98.5 F (36.9 C) (Oral)  Resp 16  Ht  (1.651 m)  Wt 230 lb (104.327 kg)  BMI 38.27 kg/m2  SpO2 100%  Physical Exam  Nursing note and vitals  reviewed. Constitutional: She is oriented to person, place, and time. She appears well-developed and well-nourished. No distress.  Appears uncomfortable  HENT:  Head: Normocephalic and atraumatic.  Eyes: Conjunctivae and EOM are normal.  Neck: Normal range of motion. Neck supple.  Cardiovascular: Normal rate.   Pulmonary/Chest: Effort normal.  Abdominal: Soft. There is no tenderness.  Musculoskeletal:       Right ankle: She exhibits decreased range of motion and swelling. She exhibits no deformity, no laceration and normal pulse. Tenderness. Lateral malleolus tenderness found. Achilles tendon normal.  Pedal pulse strong, adequate circulation, good touch sensation.  Pain with any range of motion. Swelling noted to the lateral aspect of the right ankle.   Neurological: She is alert and oriented to person, place, and time. No cranial nerve deficit.  Skin: Skin is warm and dry.  Psychiatric: She has a normal mood and affect. Her behavior is normal.    ED Course  Procedures (including critical care time)  Dr. Fayrene Fearing in to examine the patient and discuss x-ray results and plan of care.   Labs Review Labs Reviewed - No data to display  Imaging Review No results found. Dg Ankle Complete Right  10/01/2013   CLINICAL DATA:  Patient stepped in a hole and fell today  with right ankle pain and swelling.  EXAM: RIGHT ANKLE - COMPLETE 3+ VIEW  COMPARISON:  None.  FINDINGS: There is probable fracture of a osteophyte extending off of the medial malleolus. There is no other fracture. There is no dislocation. There is soft tissue swelling of the lateral ankle. There is plantar calcaneal spur.  IMPRESSION: There is probable fracture of a osteophyte extending off of the medial malleolus. Soft tissue swelling laterally.   Electronically Signed   By: Sherian Rein M.D.   On: 10/01/2013 19:27   Dg Foot Complete Right  10/01/2013   CLINICAL DATA:  Stepped into a hole and fell today with ankle and foot pain.   EXAM: RIGHT FOOT COMPLETE - 3+ VIEW  COMPARISON:  None.  FINDINGS: There is no evidence of fracture or dislocation. There is plantar calcaneal spur Soft tissues are unremarkable.  IMPRESSION: No acute fracture or dislocation of right foot   Electronically Signed   By: Sherian Rein M.D.   On: 10/01/2013 19:27    MDM  43 y.o. female with pain and swelling to the lateral aspect of the right ankle s/p fall. Will treat for ankle fracture and ligament injury. Posterior plaster splint applied, ice, elevation, crutches, pain management. Patient to follow up with ortho. She will call tomorrow for appointment. I have reviewed this patient's vital signs, nurses notes, appropriate labs and imaging.  I have discussed findings and plan of care with the patient and she agrees with plan. Stable for discharge and remains neurovascularly intact.    Medication List    TAKE these medications       oxyCODONE-acetaminophen 5-325 MG per tablet  Commonly known as:  ROXICET  Take 1-2 tablets by mouth every 4 (four) hours as needed for severe pain.     promethazine 12.5 MG tablet  Commonly known as:  PHENERGAN  Take 1 tablet (12.5 mg total) by mouth every 6 (six) hours as needed for nausea or vomiting.      ASK your doctor about these medications       albuterol 108 (90 BASE) MCG/ACT inhaler  Commonly known as:  PROVENTIL HFA;VENTOLIN HFA  Inhale 2 puffs into the lungs every 6 (six) hours as needed for wheezing or shortness of breath.     lisinopril-hydrochlorothiazide 20-25 MG per tablet  Commonly known as:  PRINZIDE,ZESTORETIC  Take 1 tablet by mouth daily.     multivitamin with minerals Tabs tablet  Take 1 tablet by mouth daily.     sertraline 100 MG tablet  Commonly known as:  ZOLOFT  Take 100 mg by mouth daily.          [facility] Orlene Och, NP 10/01/13 2014

## 2013-10-06 NOTE — ED Provider Notes (Signed)
Medical screening examination/treatment/procedure(s) were conducted as a shared visit with non-physician practitioner(s) and myself.  I personally evaluated the patient during the encounter.   EKG Interpretation None      Please see my additional dictation.   Rolland Porter, MD 10/06/13 [phone]

## 2013-11-12 ENCOUNTER — Other Ambulatory Visit: Payer: Self-pay | Admitting: Obstetrics and Gynecology

## 2013-11-13 LAB — CYTOLOGY - PAP

## 2013-11-16 ENCOUNTER — Other Ambulatory Visit: Payer: Self-pay | Admitting: Obstetrics and Gynecology

## 2013-11-16 DIAGNOSIS — R928 Other abnormal and inconclusive findings on diagnostic imaging of breast: Secondary | ICD-10-CM

## 2013-11-30 ENCOUNTER — Ambulatory Visit
Admission: RE | Admit: 2013-11-30 | Discharge: 2013-11-30 | Disposition: A | Discharge status: Home / Self Care | Payer: 59 | Source: Ambulatory Visit | Attending: Obstetrics and Gynecology | Admitting: Obstetrics and Gynecology

## 2013-11-30 DIAGNOSIS — R928 Other abnormal and inconclusive findings on diagnostic imaging of breast: Secondary | ICD-10-CM

## 2014-07-20 ENCOUNTER — Other Ambulatory Visit: Payer: Self-pay | Admitting: Obstetrics and Gynecology

## 2014-07-20 DIAGNOSIS — R921 Mammographic calcification found on diagnostic imaging of breast: Secondary | ICD-10-CM

## 2014-07-28 ENCOUNTER — Other Ambulatory Visit: Payer: Self-pay | Admitting: Obstetrics and Gynecology

## 2014-07-28 ENCOUNTER — Ambulatory Visit
Admission: RE | Admit: 2014-07-28 | Discharge: 2014-07-28 | Disposition: A | Discharge status: Home / Self Care | Payer: PRIVATE HEALTH INSURANCE | Source: Ambulatory Visit | Attending: Obstetrics and Gynecology | Admitting: Obstetrics and Gynecology

## 2014-07-28 DIAGNOSIS — N6320 Unspecified lump in the left breast, unspecified quadrant: Secondary | ICD-10-CM

## 2014-07-28 DIAGNOSIS — R921 Mammographic calcification found on diagnostic imaging of breast: Secondary | ICD-10-CM

## 2014-08-02 ENCOUNTER — Other Ambulatory Visit: Payer: Self-pay | Admitting: Obstetrics and Gynecology

## 2014-08-02 ENCOUNTER — Ambulatory Visit
Admission: RE | Admit: 2014-08-02 | Discharge: 2014-08-02 | Disposition: A | Discharge status: Home / Self Care | Payer: PRIVATE HEALTH INSURANCE | Source: Ambulatory Visit | Attending: Obstetrics and Gynecology | Admitting: Obstetrics and Gynecology

## 2014-08-02 DIAGNOSIS — R921 Mammographic calcification found on diagnostic imaging of breast: Secondary | ICD-10-CM

## 2015-10-24 IMAGING — CR DG FOOT COMPLETE 3+V*R*
3 series · 3 of 3 positions shown · non-contrast
Comparison: None.

CLINICAL DATA: Stepped into a hole and fell today with ankle and
foot pain.

EXAM:
RIGHT FOOT COMPLETE - 3+ VIEW

[view not recorded (1 of 3)]
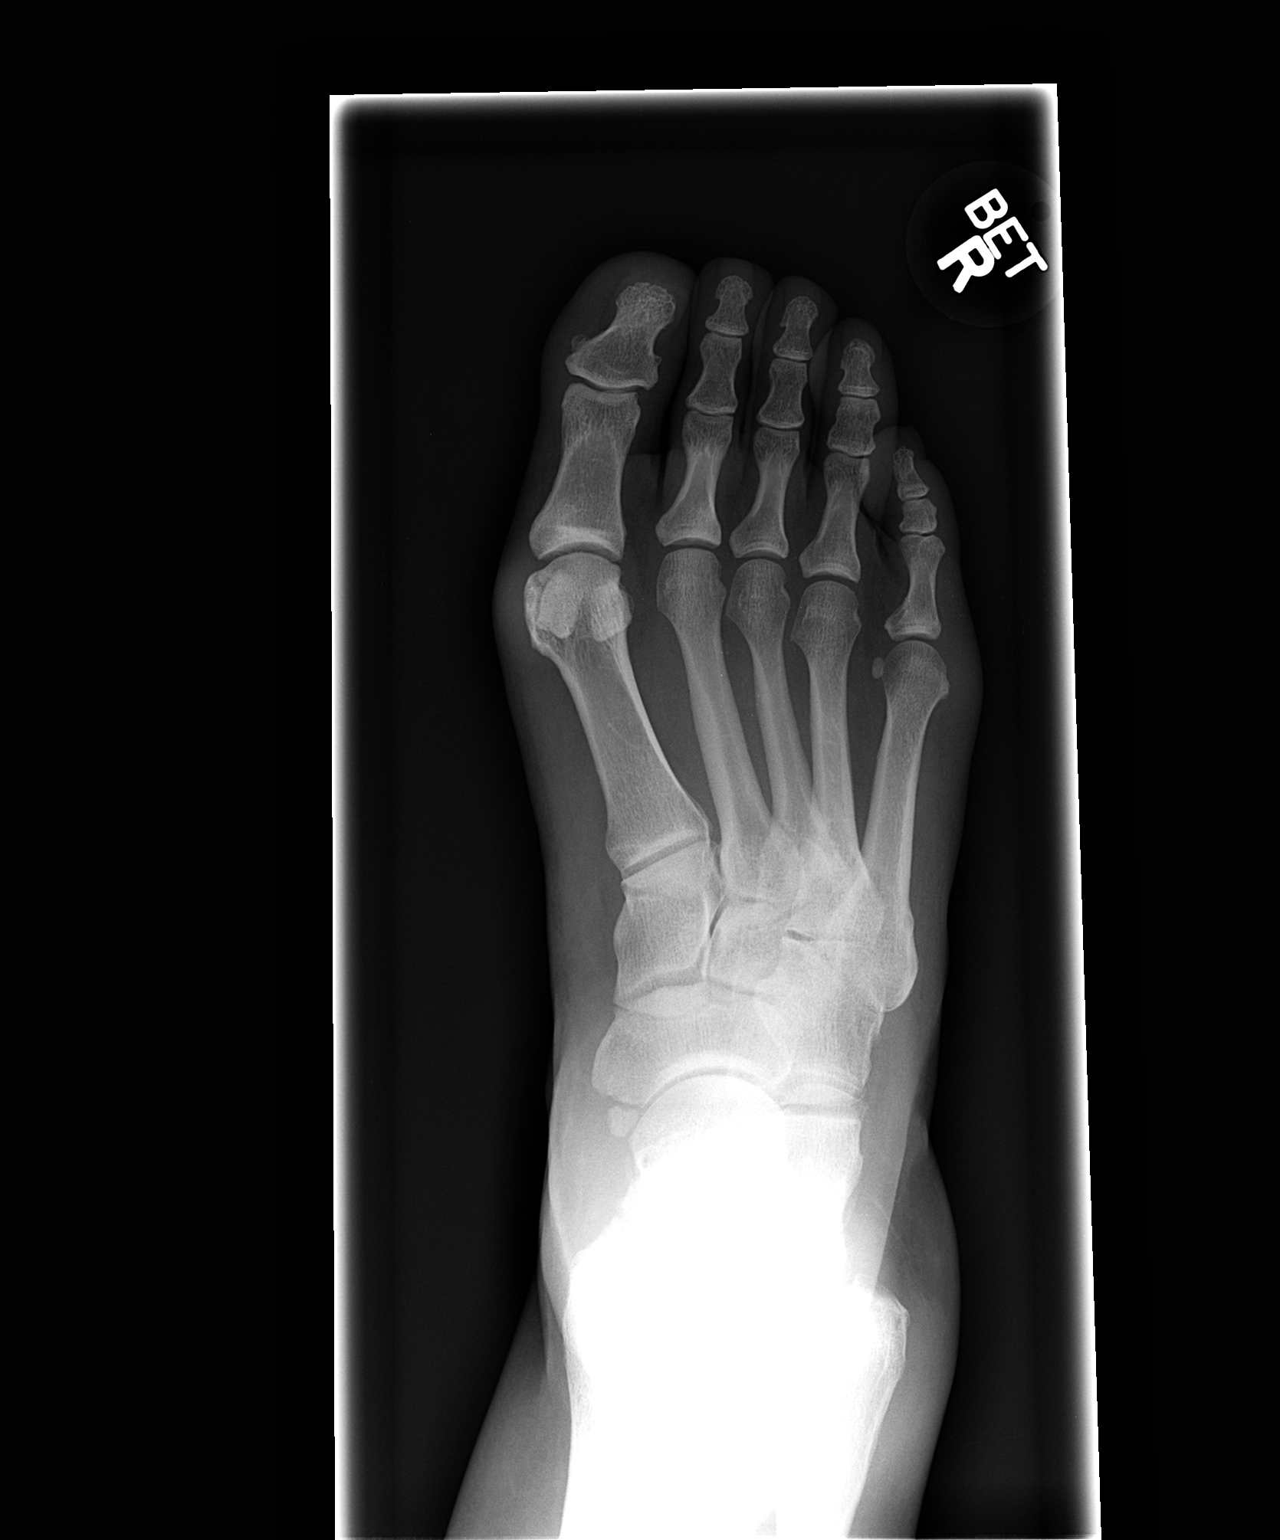

[view not recorded (2 of 3)]
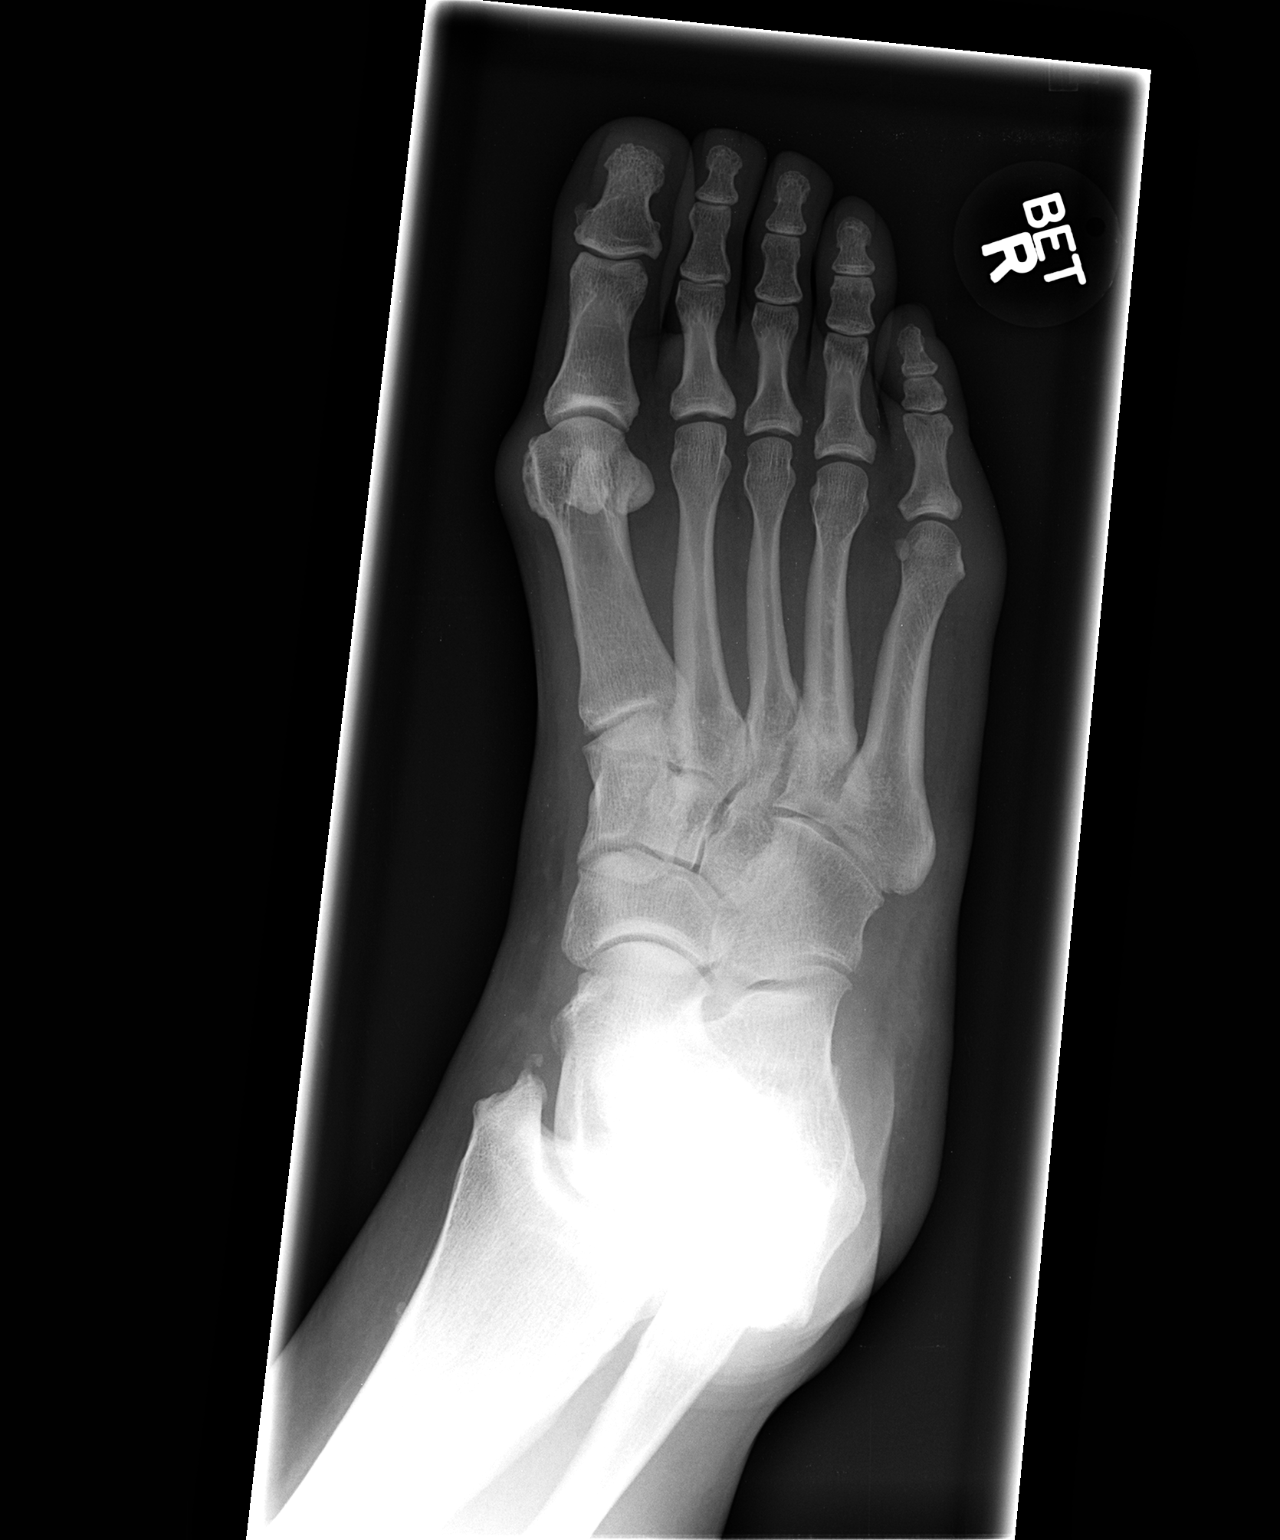

[view not recorded (3 of 3)]
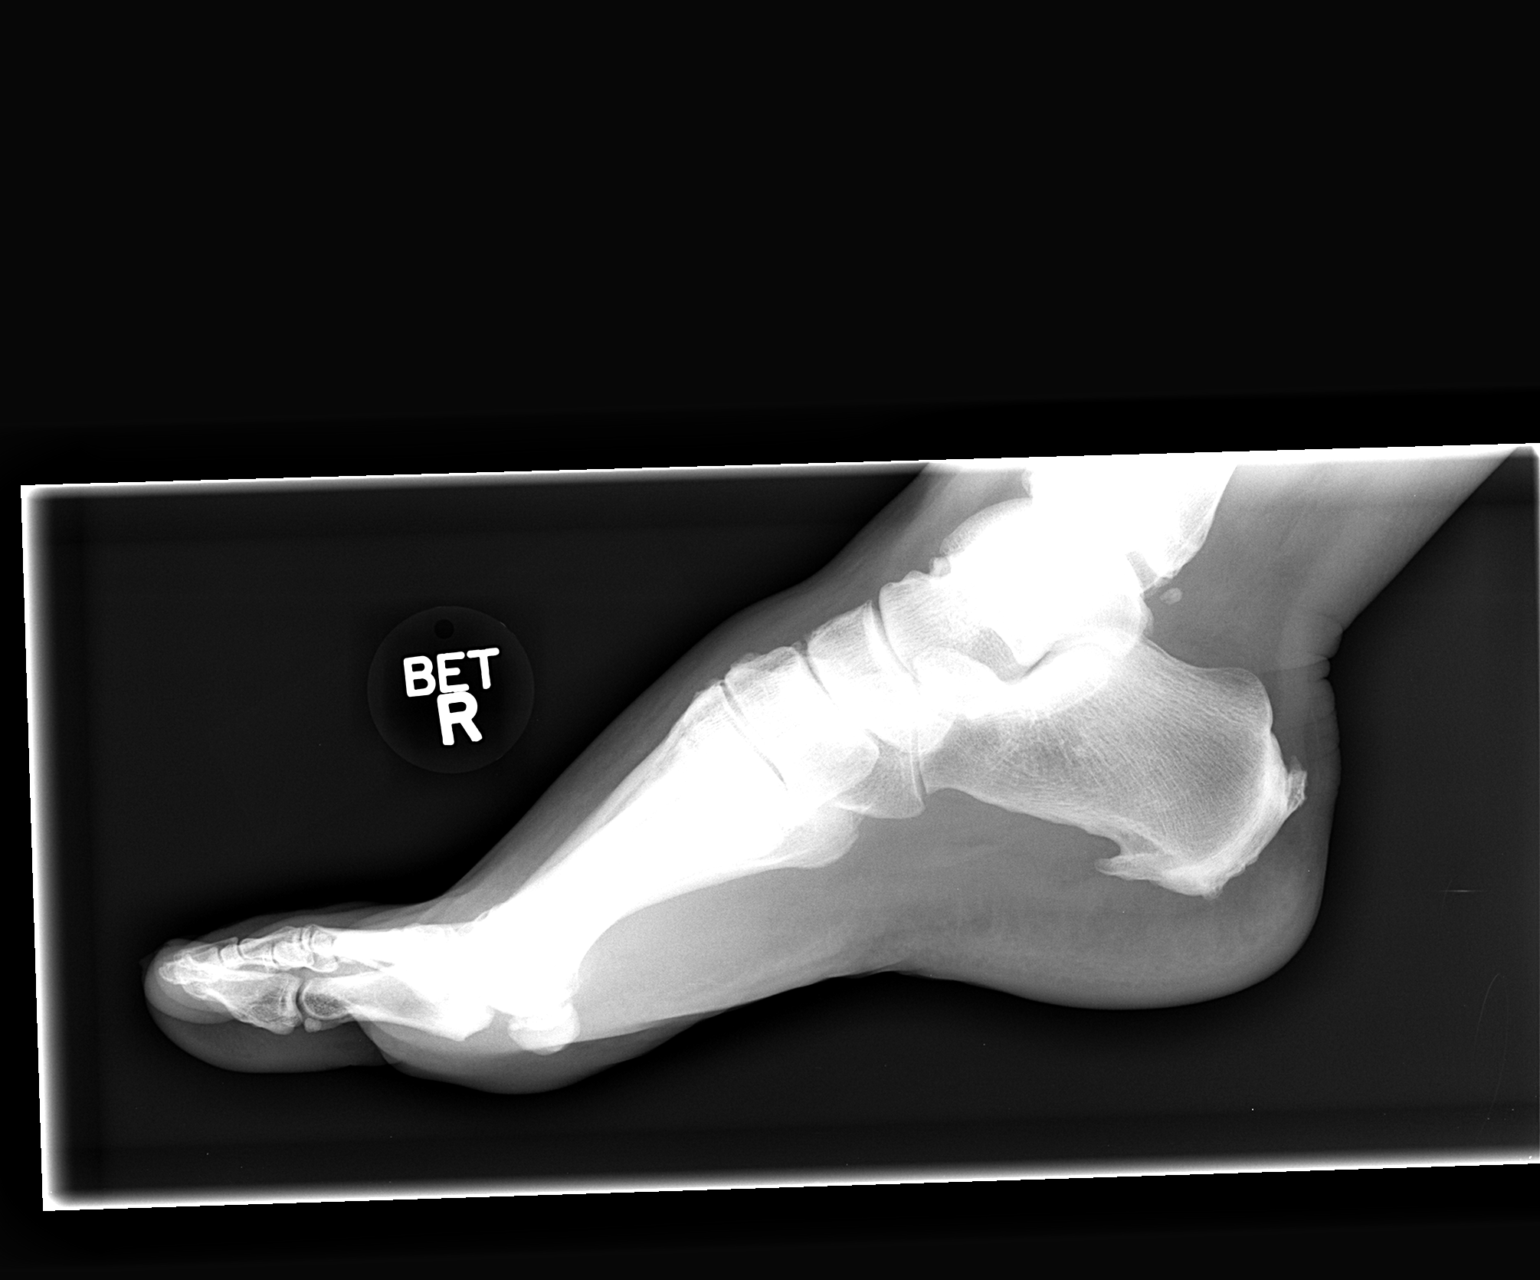

[3 of 3 positions shown; findings below may reference images not displayed]

FINDINGS: There is no evidence of fracture or dislocation. There is plantar
calcaneal spur Soft tissues are unremarkable.
IMPRESSION: No acute fracture or dislocation of right foot

## 2015-10-24 IMAGING — CR DG ANKLE COMPLETE 3+V*R*
3 series · 3 of 3 positions shown · non-contrast
Comparison: None.

CLINICAL DATA: Patient stepped in a hole and fell today with right
ankle pain and swelling.

EXAM:
RIGHT ANKLE - COMPLETE 3+ VIEW

[view not recorded (1 of 3)]
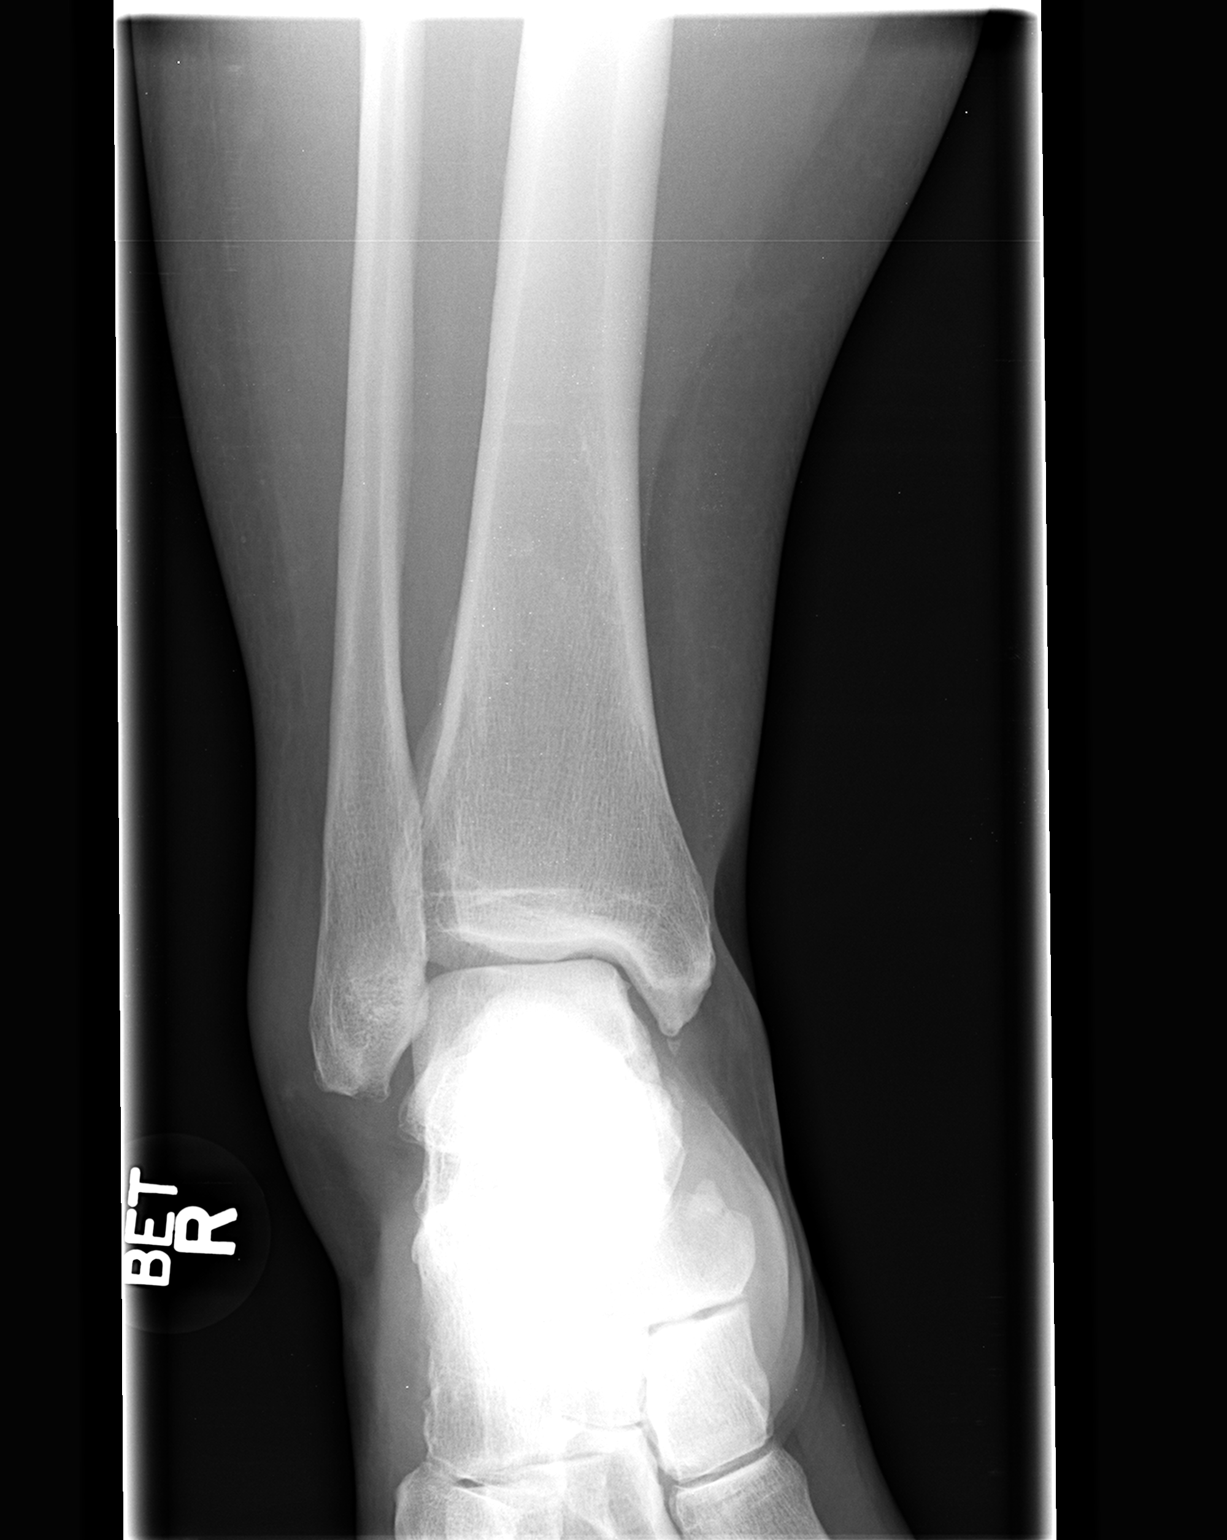

[view not recorded (2 of 3)]
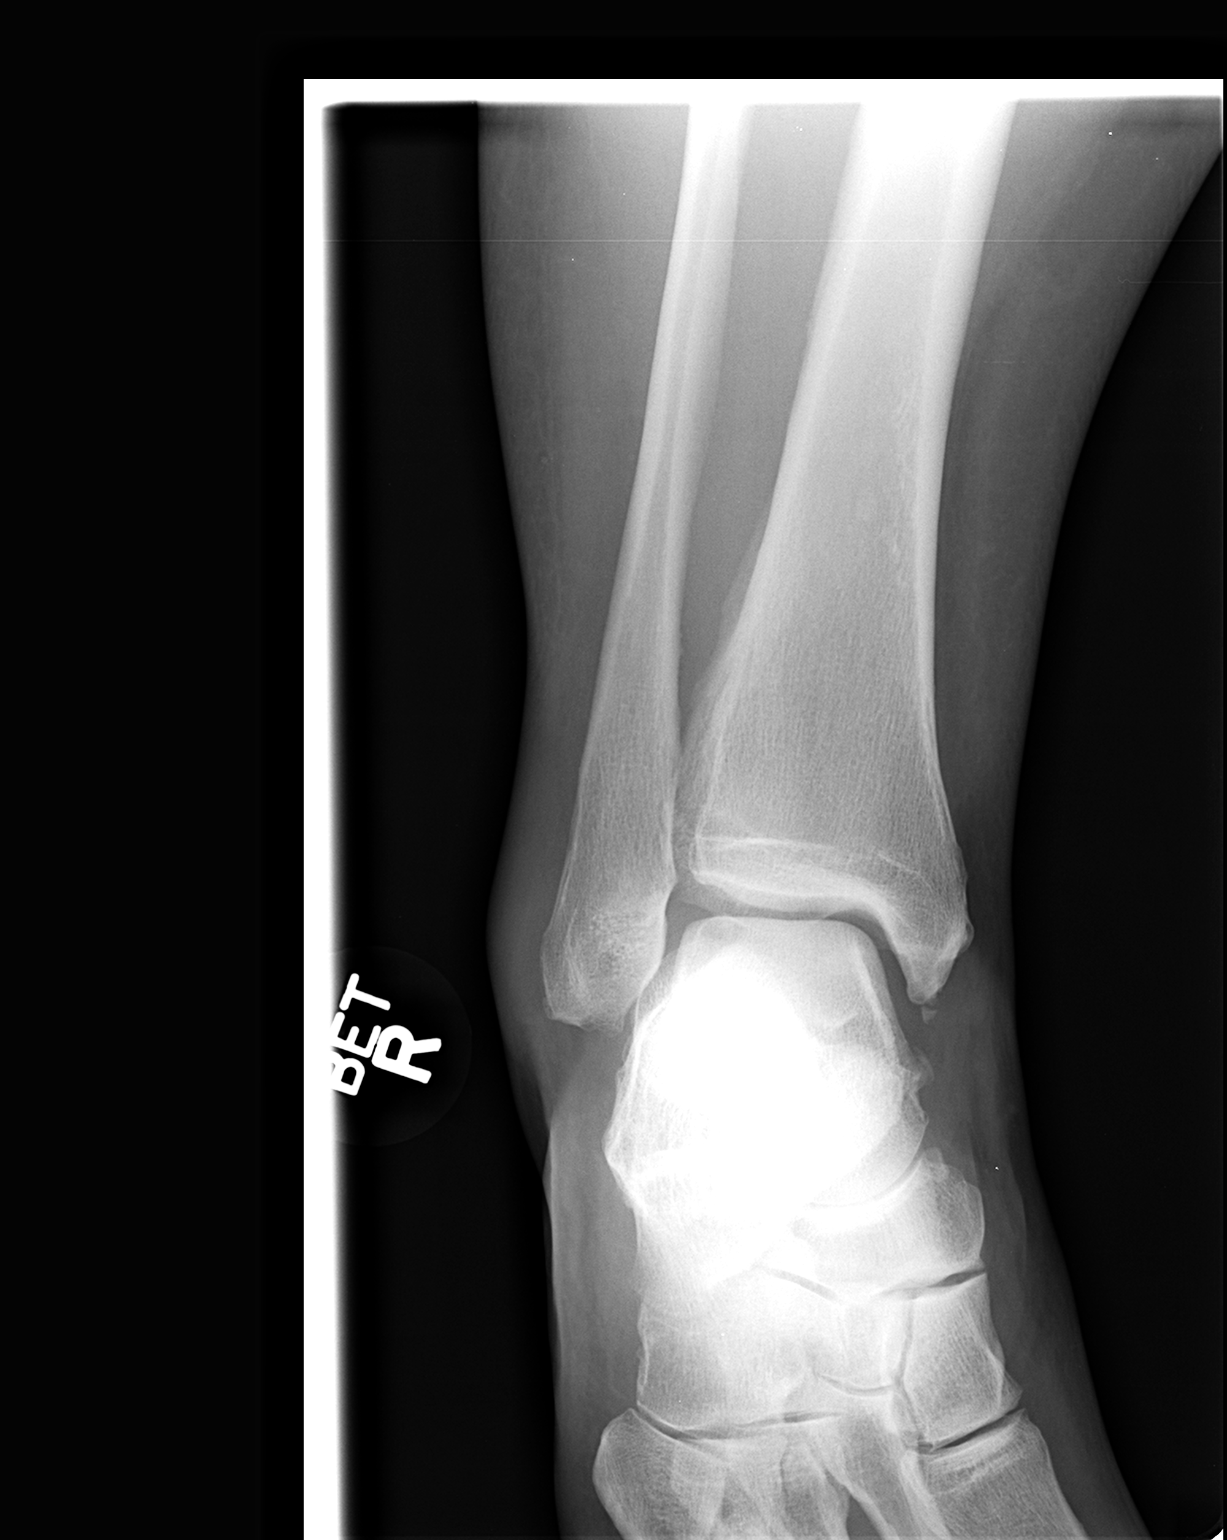

[view not recorded (3 of 3)]
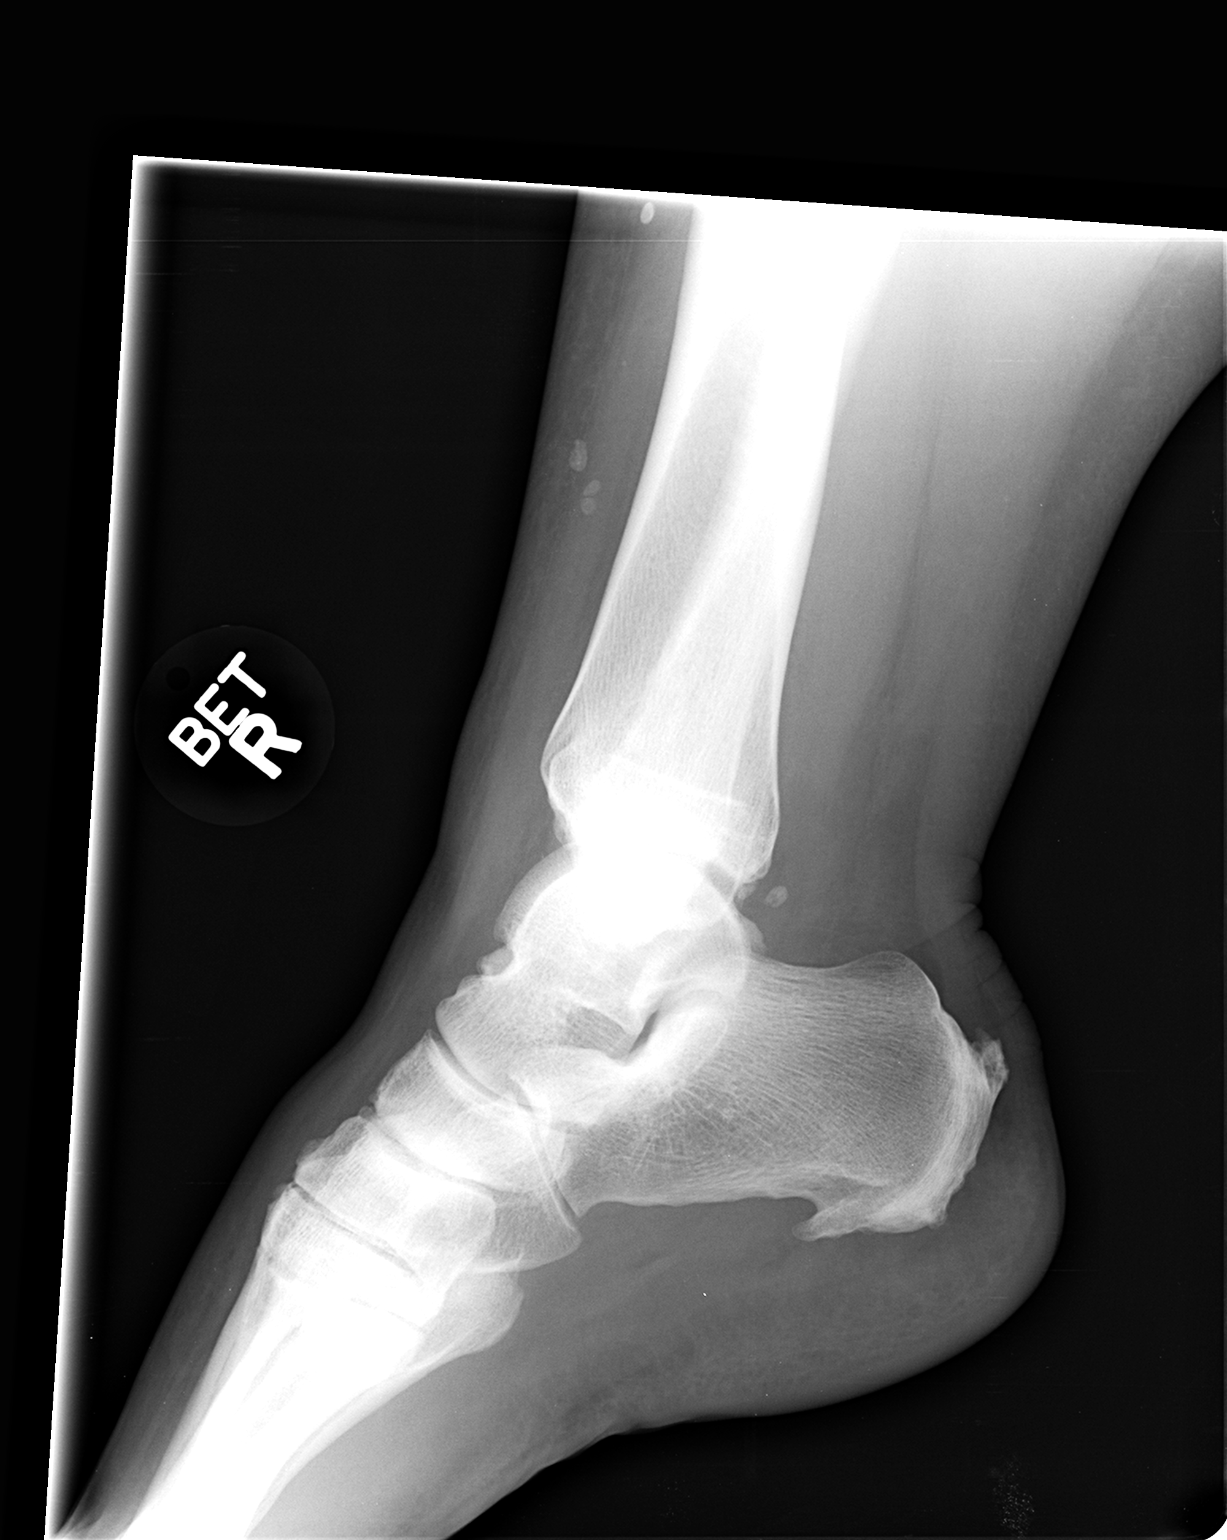

[3 of 3 positions shown; findings below may reference images not displayed]

FINDINGS: There is probable fracture of a osteophyte extending off of the
medial malleolus. There is no other fracture. There is no
dislocation. There is soft tissue swelling of the lateral ankle.
There is plantar calcaneal spur.
IMPRESSION: There is probable fracture of a osteophyte extending off of the
medial malleolus. Soft tissue swelling laterally.

## 2016-08-03 ENCOUNTER — Other Ambulatory Visit: Payer: Self-pay | Admitting: Obstetrics and Gynecology

## 2016-08-03 DIAGNOSIS — R921 Mammographic calcification found on diagnostic imaging of breast: Secondary | ICD-10-CM

## 2016-08-17 ENCOUNTER — Other Ambulatory Visit: Payer: Self-pay | Admitting: Obstetrics and Gynecology

## 2016-08-17 ENCOUNTER — Ambulatory Visit
Admission: RE | Admit: 2016-08-17 | Discharge: 2016-08-17 | Disposition: A | Discharge status: Home / Self Care | Payer: No Typology Code available for payment source | Source: Ambulatory Visit | Attending: Obstetrics and Gynecology | Admitting: Obstetrics and Gynecology

## 2016-08-17 DIAGNOSIS — N6489 Other specified disorders of breast: Secondary | ICD-10-CM

## 2016-08-17 DIAGNOSIS — R921 Mammographic calcification found on diagnostic imaging of breast: Secondary | ICD-10-CM

## 2018-11-28 DIAGNOSIS — H5203 Hypermetropia, bilateral: Secondary | ICD-10-CM | POA: Diagnosis not present

## 2018-12-15 DIAGNOSIS — U071 COVID-19: Secondary | ICD-10-CM | POA: Diagnosis not present

## 2018-12-15 DIAGNOSIS — Z20828 Contact with and (suspected) exposure to other viral communicable diseases: Secondary | ICD-10-CM | POA: Diagnosis not present

## 2019-01-24 DIAGNOSIS — Z23 Encounter for immunization: Secondary | ICD-10-CM | POA: Diagnosis not present

## 2019-01-30 DIAGNOSIS — E119 Type 2 diabetes mellitus without complications: Secondary | ICD-10-CM | POA: Diagnosis not present

## 2019-01-30 DIAGNOSIS — I10 Essential (primary) hypertension: Secondary | ICD-10-CM | POA: Diagnosis not present

## 2019-01-30 DIAGNOSIS — F321 Major depressive disorder, single episode, moderate: Secondary | ICD-10-CM | POA: Diagnosis not present

## 2019-01-30 DIAGNOSIS — E782 Mixed hyperlipidemia: Secondary | ICD-10-CM | POA: Diagnosis not present

## 2019-01-30 DIAGNOSIS — G43909 Migraine, unspecified, not intractable, without status migrainosus: Secondary | ICD-10-CM | POA: Diagnosis not present

## 2019-02-17 MED FILL — ROSUVASTATIN CALCIUM 5 MG T: 5 | 30 days supply | Qty: 30 | Fill #0

## 2019-02-17 MED FILL — LISINOPRIL-HCTZ 20-25 MG TA: 20-25 | 30 days supply | Qty: 30 | Fill #0

## 2019-02-17 MED FILL — SERTRALINE HCL 100 MG TAB: 100 | 30 days supply | Qty: 45 | Fill #0

## 2019-02-17 MED FILL — glipiZIDE 10 MG TABS: 10 | 90 days supply | Qty: 90 | Fill #0

## 2019-02-21 DIAGNOSIS — Z23 Encounter for immunization: Secondary | ICD-10-CM | POA: Diagnosis not present

## 2019-03-19 MED FILL — SERTRALINE HCL 100 MG TAB: 100 | 30 days supply | Qty: 45 | Fill #1

## 2019-03-19 MED FILL — LISINOPRIL-HCTZ 20-25 MG TA: 20-25 | 30 days supply | Qty: 30 | Fill #1

## 2019-03-19 MED FILL — METFORMIN HCL ER 500 MG TB2: 500 | 30 days supply | Qty: 120 | Fill #0

## 2019-03-19 MED FILL — ROSUVASTATIN CALCIUM 5 MG T: 5 | 30 days supply | Qty: 30 | Fill #1

## 2019-04-16 MED FILL — SERTRALINE HCL 100 MG TAB: 100 | 30 days supply | Qty: 45 | Fill #0

## 2019-04-16 MED FILL — ROSUVASTATIN CALCIUM 5 MG T: 5 | 30 days supply | Qty: 30 | Fill #2

## 2019-04-16 MED FILL — LISINOPRIL-HCTZ 20-25 MG TA: 20-25 | 30 days supply | Qty: 30 | Fill #0

## 2019-04-16 MED FILL — METFORMIN HCL ER 500 MG TB2: 500 | 30 days supply | Qty: 120 | Fill #1

## 2019-05-14 MED FILL — METFORMIN HCL ER 500 MG TB2: 500 | 30 days supply | Qty: 120 | Fill #2

## 2019-05-14 MED FILL — SERTRALINE HCL 100 MG TAB: 100 | 30 days supply | Qty: 45 | Fill #1

## 2019-05-14 MED FILL — ROSUVASTATIN CALCIUM 5 MG T: 5 | 30 days supply | Qty: 30 | Fill #3

## 2019-05-14 MED FILL — glipiZIDE 10 MG TABS: 10 | 90 days supply | Qty: 90 | Fill #1

## 2019-05-14 MED FILL — LISINOPRIL-HCTZ 20-25 MG TA: 20-25 | 90 days supply | Qty: 90 | Fill #0

## 2019-06-19 MED FILL — ROSUVASTATIN CALCIUM 5 MG T: 5 | 30 days supply | Qty: 30 | Fill #4

## 2019-06-19 MED FILL — METFORMIN HCL ER 500 MG TB2: 500 | 30 days supply | Qty: 120 | Fill #3

## 2019-07-20 MED FILL — ROSUVASTATIN CALCIUM 5 MG T: 5 | 30 days supply | Qty: 30 | Fill #5

## 2019-07-20 MED FILL — METFORMIN HCL ER 500 MG TB2: 500 | 30 days supply | Qty: 120 | Fill #4

## 2019-08-03 MED FILL — SERTRALINE HCL 100 MG TAB: 100 | 30 days supply | Qty: 45 | Fill #0

## 2019-08-19 MED FILL — ROSUVASTATIN CALCIUM 5 MG T: 5 | 30 days supply | Qty: 30 | Fill #6

## 2019-08-19 MED FILL — LISINOPRIL-HCTZ 20-25 MG TA: 20-25 | 90 days supply | Qty: 90 | Fill #0

## 2019-08-24 MED FILL — METFORMIN HCL ER 500 MG TB2: 500 | 30 days supply | Qty: 120 | Fill #0

## 2019-08-25 ENCOUNTER — Other Ambulatory Visit (HOSPITAL_COMMUNITY): Payer: Self-pay | Admitting: General Practice

## 2019-08-25 DIAGNOSIS — F321 Major depressive disorder, single episode, moderate: Secondary | ICD-10-CM | POA: Diagnosis not present

## 2019-08-25 DIAGNOSIS — E119 Type 2 diabetes mellitus without complications: Secondary | ICD-10-CM | POA: Diagnosis not present

## 2019-08-25 DIAGNOSIS — G43909 Migraine, unspecified, not intractable, without status migrainosus: Secondary | ICD-10-CM | POA: Diagnosis not present

## 2019-08-25 DIAGNOSIS — E782 Mixed hyperlipidemia: Secondary | ICD-10-CM | POA: Diagnosis not present

## 2019-08-25 DIAGNOSIS — Z6836 Body mass index (BMI) 36.0-36.9, adult: Secondary | ICD-10-CM | POA: Diagnosis not present

## 2019-08-25 DIAGNOSIS — Z1389 Encounter for screening for other disorder: Secondary | ICD-10-CM | POA: Diagnosis not present

## 2019-08-25 DIAGNOSIS — I10 Essential (primary) hypertension: Secondary | ICD-10-CM | POA: Diagnosis not present

## 2019-08-25 DIAGNOSIS — Z1331 Encounter for screening for depression: Secondary | ICD-10-CM | POA: Diagnosis not present

## 2019-08-27 ENCOUNTER — Other Ambulatory Visit (HOSPITAL_COMMUNITY): Payer: Self-pay | Admitting: General Practice

## 2019-08-27 MED FILL — OZEMPIC 0.25 OR 0.5 MG/DOSE: 2 | 28 days supply | Qty: 2 | Fill #0

## 2019-08-27 MED FILL — SERTRALINE HCL 100 MG TAB: 100 | 90 days supply | Qty: 135 | Fill #0

## 2019-09-04 ENCOUNTER — Other Ambulatory Visit (HOSPITAL_COMMUNITY): Payer: Self-pay | Admitting: General Practice

## 2019-09-10 MED FILL — DEXCOM G6 TRANSMITTER MISC: 90 days supply | Qty: 1 | Fill #0

## 2019-09-10 MED FILL — DEXCOM G6 SENSOR MISC: 30 days supply | Qty: 3 | Fill #0

## 2019-09-15 DIAGNOSIS — J019 Acute sinusitis, unspecified: Secondary | ICD-10-CM | POA: Diagnosis not present

## 2019-09-15 DIAGNOSIS — J209 Acute bronchitis, unspecified: Secondary | ICD-10-CM | POA: Diagnosis not present

## 2019-09-15 MED FILL — glipiZIDE 10 MG TABS: 10 | 90 days supply | Qty: 90 | Fill #2

## 2019-09-22 MED FILL — METFORMIN HCL ER 500 MG TB2: 500 | 90 days supply | Qty: 360 | Fill #0

## 2019-10-06 MED FILL — ROSUVASTATIN CALCIUM 5 MG T: 5 | 30 days supply | Qty: 30 | Fill #7

## 2019-10-06 MED FILL — OZEMPIC 0.25 OR 0.5 MG/DOSE: 2 | 28 days supply | Qty: 2 | Fill #1

## 2019-10-13 MED FILL — DEXCOM G6 SENSOR MISC: 30 days supply | Qty: 3 | Fill #1

## 2019-11-13 MED FILL — OZEMPIC 0.25 OR 0.5 MG/DOSE: 2 | 42 days supply | Qty: 2 | Fill #0

## 2019-11-13 MED FILL — ROSUVASTATIN CALCIUM 5 MG T: 5 | 30 days supply | Qty: 30 | Fill #0

## 2019-11-25 MED FILL — LISINOPRIL-HCTZ 20-25 MG TA: 20-25 | 90 days supply | Qty: 90 | Fill #0

## 2019-11-26 DIAGNOSIS — I10 Essential (primary) hypertension: Secondary | ICD-10-CM | POA: Diagnosis not present

## 2019-11-26 DIAGNOSIS — E782 Mixed hyperlipidemia: Secondary | ICD-10-CM | POA: Diagnosis not present

## 2019-11-26 DIAGNOSIS — E119 Type 2 diabetes mellitus without complications: Secondary | ICD-10-CM | POA: Diagnosis not present

## 2019-11-26 DIAGNOSIS — R7301 Impaired fasting glucose: Secondary | ICD-10-CM | POA: Diagnosis not present

## 2019-11-30 MED FILL — DEXCOM G6 SENSOR MISC: 30 days supply | Qty: 3 | Fill #2

## 2019-12-01 ENCOUNTER — Other Ambulatory Visit (HOSPITAL_COMMUNITY): Payer: Self-pay | Admitting: General Practice

## 2019-12-01 DIAGNOSIS — E119 Type 2 diabetes mellitus without complications: Secondary | ICD-10-CM | POA: Diagnosis not present

## 2019-12-01 DIAGNOSIS — Z6835 Body mass index (BMI) 35.0-35.9, adult: Secondary | ICD-10-CM | POA: Diagnosis not present

## 2019-12-01 DIAGNOSIS — G43909 Migraine, unspecified, not intractable, without status migrainosus: Secondary | ICD-10-CM | POA: Diagnosis not present

## 2019-12-01 DIAGNOSIS — E782 Mixed hyperlipidemia: Secondary | ICD-10-CM | POA: Diagnosis not present

## 2019-12-01 DIAGNOSIS — I10 Essential (primary) hypertension: Secondary | ICD-10-CM | POA: Diagnosis not present

## 2019-12-01 DIAGNOSIS — F321 Major depressive disorder, single episode, moderate: Secondary | ICD-10-CM | POA: Diagnosis not present

## 2019-12-01 MED FILL — SERTRALINE HCL 100 MG TAB: 100 | 90 days supply | Qty: 135 | Fill #0

## 2019-12-22 ENCOUNTER — Other Ambulatory Visit (HOSPITAL_COMMUNITY): Payer: Self-pay | Admitting: Family Medicine

## 2019-12-22 MED FILL — OZEMPIC 0.25 OR 0.5 MG/DOSE: 2 | 84 days supply | Qty: 5 | Fill #0

## 2019-12-22 MED FILL — glipiZIDE 10 MG TABS: 10 | 90 days supply | Qty: 90 | Fill #0

## 2019-12-22 MED FILL — ROSUVASTATIN CALCIUM 5 MG T: 5 | 30 days supply | Qty: 30 | Fill #0

## 2019-12-28 MED FILL — METFORMIN HCL ER 500 MG TB2: 500 | 90 days supply | Qty: 360 | Fill #1

## 2019-12-30 MED FILL — DEXCOM G6 TRANSMITTER MISC: 90 days supply | Qty: 1 | Fill #1

## 2020-01-11 MED FILL — DEXCOM G6 SENSOR MISC: 30 days supply | Qty: 3 | Fill #3

## 2020-01-26 ENCOUNTER — Other Ambulatory Visit (HOSPITAL_COMMUNITY): Payer: Self-pay | Admitting: General Practice

## 2020-01-26 MED FILL — ROSUVASTATIN CALCIUM 5 MG T: 5 | 30 days supply | Qty: 30 | Fill #0

## 2020-02-10 MED FILL — DEXCOM G6 SENSOR MISC: 30 days supply | Qty: 3 | Fill #4

## 2020-02-26 MED FILL — ROSUVASTATIN CALCIUM 5 MG T: 5 | 30 days supply | Qty: 30 | Fill #1

## 2020-02-26 MED FILL — LISINOPRIL-HCTZ 20-25 MG TA: 20-25 | 90 days supply | Qty: 90 | Fill #0

## 2020-03-02 ENCOUNTER — Other Ambulatory Visit (HOSPITAL_COMMUNITY): Payer: Self-pay | Admitting: General Practice

## 2020-03-02 MED FILL — SERTRALINE HCL 100 MG TAB: 100 | 90 days supply | Qty: 135 | Fill #0

## 2020-03-11 MED FILL — DEXCOM G6 TRANSMITTER MISC: 90 days supply | Qty: 1 | Fill #2

## 2020-03-30 ENCOUNTER — Other Ambulatory Visit (HOSPITAL_COMMUNITY): Payer: Self-pay | Admitting: General Practice

## 2020-04-04 ENCOUNTER — Other Ambulatory Visit (HOSPITAL_COMMUNITY): Payer: Self-pay | Admitting: General Practice

## 2020-04-04 MED FILL — ROSUVASTATIN CALCIUM 5 MG T: 5 | 30 days supply | Qty: 30 | Fill #0

## 2020-04-06 ENCOUNTER — Other Ambulatory Visit (HOSPITAL_COMMUNITY): Payer: Self-pay | Admitting: General Practice

## 2020-04-06 DIAGNOSIS — I10 Essential (primary) hypertension: Secondary | ICD-10-CM | POA: Diagnosis not present

## 2020-04-06 DIAGNOSIS — E782 Mixed hyperlipidemia: Secondary | ICD-10-CM | POA: Diagnosis not present

## 2020-04-06 DIAGNOSIS — Z6834 Body mass index (BMI) 34.0-34.9, adult: Secondary | ICD-10-CM | POA: Diagnosis not present

## 2020-04-06 DIAGNOSIS — F321 Major depressive disorder, single episode, moderate: Secondary | ICD-10-CM | POA: Diagnosis not present

## 2020-04-06 DIAGNOSIS — G43909 Migraine, unspecified, not intractable, without status migrainosus: Secondary | ICD-10-CM | POA: Diagnosis not present

## 2020-04-06 DIAGNOSIS — E7849 Other hyperlipidemia: Secondary | ICD-10-CM | POA: Diagnosis not present

## 2020-04-06 DIAGNOSIS — E119 Type 2 diabetes mellitus without complications: Secondary | ICD-10-CM | POA: Diagnosis not present

## 2020-04-08 ENCOUNTER — Other Ambulatory Visit (HOSPITAL_COMMUNITY): Payer: Self-pay | Admitting: General Practice

## 2020-04-16 ENCOUNTER — Other Ambulatory Visit (HOSPITAL_COMMUNITY): Payer: Self-pay

## 2020-04-26 ENCOUNTER — Other Ambulatory Visit (HOSPITAL_COMMUNITY): Payer: Self-pay

## 2020-05-03 ENCOUNTER — Other Ambulatory Visit (HOSPITAL_COMMUNITY): Payer: Self-pay

## 2020-05-03 MED FILL — Semaglutide Soln Pen-inj 1 MG/DOSE (4 MG/3ML): SUBCUTANEOUS | 84 days supply | Qty: 9 | Fill #0 | Status: AC

## 2020-05-03 MED FILL — Continuous Glucose System Sensor: 30 days supply | Qty: 3 | Fill #0 | Status: AC

## 2020-05-06 ENCOUNTER — Other Ambulatory Visit (HOSPITAL_COMMUNITY): Payer: Self-pay

## 2020-05-06 MED FILL — Rosuvastatin Calcium Tab 5 MG: ORAL | 30 days supply | Qty: 30 | Fill #0 | Status: AC

## 2020-05-31 ENCOUNTER — Other Ambulatory Visit (HOSPITAL_COMMUNITY): Payer: Self-pay

## 2020-05-31 MED ORDER — LISINOPRIL-HYDROCHLOROTHIAZIDE 20-25 MG PO TABS
1.0000 | ORAL_TABLET | Freq: Every day | ORAL | 0 refills | Status: DC
  Filled 2020-05-31: qty 90, 90d supply, fill #0

## 2020-05-31 MED FILL — Continuous Glucose System Transmitter: 90 days supply | Qty: 1 | Fill #0 | Status: AC

## 2020-06-03 ENCOUNTER — Other Ambulatory Visit (HOSPITAL_COMMUNITY): Payer: Self-pay

## 2020-06-07 ENCOUNTER — Other Ambulatory Visit (HOSPITAL_COMMUNITY): Payer: Self-pay

## 2020-06-08 ENCOUNTER — Other Ambulatory Visit (HOSPITAL_COMMUNITY): Payer: Self-pay

## 2020-06-09 ENCOUNTER — Other Ambulatory Visit (HOSPITAL_COMMUNITY): Payer: Self-pay

## 2020-06-09 MED FILL — Rosuvastatin Calcium Tab 5 MG: ORAL | 30 days supply | Qty: 30 | Fill #1 | Status: AC

## 2020-06-10 ENCOUNTER — Other Ambulatory Visit (HOSPITAL_COMMUNITY): Payer: Self-pay

## 2020-06-10 MED ORDER — SERTRALINE HCL 100 MG PO TABS
1.5000 | ORAL_TABLET | Freq: Every day | ORAL | 0 refills | Status: DC
  Filled 2020-06-10: qty 135, 90d supply, fill #0

## 2020-06-16 ENCOUNTER — Emergency Department (HOSPITAL_COMMUNITY)
Admission: EM | Admit: 2020-06-16 | Discharge: 2020-06-16 | Disposition: A | Discharge status: Home / Self Care | Payer: 59 | Attending: Emergency Medicine | Admitting: Emergency Medicine

## 2020-06-16 ENCOUNTER — Encounter (HOSPITAL_COMMUNITY): Payer: Self-pay | Admitting: *Deleted

## 2020-06-16 ENCOUNTER — Other Ambulatory Visit: Payer: Self-pay

## 2020-06-16 DIAGNOSIS — Z79899 Other long term (current) drug therapy: Secondary | ICD-10-CM | POA: Insufficient documentation

## 2020-06-16 DIAGNOSIS — E119 Type 2 diabetes mellitus without complications: Secondary | ICD-10-CM | POA: Diagnosis not present

## 2020-06-16 DIAGNOSIS — Z951 Presence of aortocoronary bypass graft: Secondary | ICD-10-CM | POA: Diagnosis not present

## 2020-06-16 DIAGNOSIS — R000 Tachycardia, unspecified: Secondary | ICD-10-CM | POA: Diagnosis not present

## 2020-06-16 DIAGNOSIS — I10 Essential (primary) hypertension: Secondary | ICD-10-CM | POA: Diagnosis not present

## 2020-06-16 DIAGNOSIS — Z7984 Long term (current) use of oral hypoglycemic drugs: Secondary | ICD-10-CM | POA: Diagnosis not present

## 2020-06-16 DIAGNOSIS — A084 Viral intestinal infection, unspecified: Secondary | ICD-10-CM | POA: Insufficient documentation

## 2020-06-16 DIAGNOSIS — I2510 Atherosclerotic heart disease of native coronary artery without angina pectoris: Secondary | ICD-10-CM | POA: Insufficient documentation

## 2020-06-16 DIAGNOSIS — R112 Nausea with vomiting, unspecified: Secondary | ICD-10-CM | POA: Diagnosis present

## 2020-06-16 DIAGNOSIS — R55 Syncope and collapse: Secondary | ICD-10-CM | POA: Diagnosis not present

## 2020-06-16 HISTORY — DX: Type 2 diabetes mellitus without complications: E11.9

## 2020-06-16 LAB — COMPREHENSIVE METABOLIC PANEL
ALT: 19 U/L (ref 0–44)
AST: 19 U/L (ref 15–41)
Albumin: 4 g/dL (ref 3.5–5.0)
Alkaline Phosphatase: 43 U/L (ref 38–126)
Anion gap: 11 (ref 5–15)
BUN: 15 mg/dL (ref 6–20)
CO2: 23 mmol/L (ref 22–32)
Calcium: 8.7 mg/dL — ABNORMAL LOW (ref 8.9–10.3)
Chloride: 100 mmol/L (ref 98–111)
Creatinine, Ser: 0.88 mg/dL (ref 0.44–1.00)
GFR, Estimated: >60 mL/min (ref >60)
Glucose, Bld: 207 mg/dL — ABNORMAL HIGH (ref 70–99)
Potassium: 3.5 mmol/L (ref 3.5–5.1)
Sodium: 134 mmol/L — ABNORMAL LOW (ref 135–145)
Total Bilirubin: 0.5 mg/dL (ref 0.3–1.2)
Total Protein: 7.1 g/dL (ref 6.5–8.1)

## 2020-06-16 LAB — URINALYSIS, ROUTINE W REFLEX MICROSCOPIC
Bilirubin Urine: NEGATIVE
Glucose, UA: NEGATIVE mg/dL
Hgb urine dipstick: NEGATIVE
Ketones, ur: 5 mg/dL — AB
Leukocytes,Ua: NEGATIVE
Nitrite: NEGATIVE
Protein, ur: 30 mg/dL — AB
Specific Gravity, Urine: 1.023 (ref 1.005–1.030)
pH: 5 (ref 5.0–8.0)

## 2020-06-16 LAB — CBC
HCT: 45.8 % (ref 36.0–46.0)
Hemoglobin: 15.3 g/dL — ABNORMAL HIGH (ref 12.0–15.0)
MCH: 30.2 pg (ref 26.0–34.0)
MCHC: 33.4 g/dL (ref 30.0–36.0)
MCV: 90.3 fL (ref 80.0–100.0)
Platelets: 325 10*3/uL (ref 150–400)
RBC: 5.07 MIL/uL (ref 3.87–5.11)
RDW: 12.4 % (ref 11.5–15.5)
WBC: 12.2 10*3/uL — ABNORMAL HIGH (ref 4.0–10.5)
nRBC: 0 % (ref 0.0–0.2)

## 2020-06-16 LAB — TROPONIN I (HIGH SENSITIVITY)
Troponin I (High Sensitivity): <2 ng/L (ref <18)
Troponin I (High Sensitivity): 2 ng/L (ref <18)

## 2020-06-16 MED ORDER — ONDANSETRON HCL 4 MG/2ML IJ SOLN
4.0000 mg | Freq: Once | INTRAMUSCULAR | Status: AC
  Administered 2020-06-16: 4 mg via INTRAVENOUS
  Filled 2020-06-16: qty 2

## 2020-06-16 MED ORDER — ONDANSETRON HCL 4 MG PO TABS: 4.0000 mg | ORAL_TABLET | Freq: Three times a day (TID) | ORAL | 0 refills | Status: AC | PRN

## 2020-06-16 MED ORDER — SODIUM CHLORIDE 0.9 % IV BOLUS
1000.0000 mL | Freq: Once | INTRAVENOUS | Status: AC
  Administered 2020-06-16: 1000 mL via INTRAVENOUS

## 2020-06-16 MED ORDER — ONDANSETRON 4 MG PO TBDP
4.0000 mg | ORAL_TABLET | Freq: Three times a day (TID) | ORAL | 0 refills | Status: AC | PRN
Start: 2020-06-16 — End: ?

## 2020-06-16 NOTE — Discharge Instructions (Addendum)
Your exam and labs suggest that is a viral gastroenteritis.  Continue taking the zofran if needed for vomiting.  Rest and make sure you are hydrating.  Take imodium only if you continue having diarrhea.  I recommend a bland diet for the next day, then slowly increasing as tolerated (bland includes broth, jello, plain rice, banana, toast).

## 2020-06-16 NOTE — ED Triage Notes (Signed)
Vomiting, poor appetite, high heart rate onset 5 days ago, possible dehydration

## 2020-06-16 NOTE — ED Provider Notes (Signed)
[facility] EMERGENCY DEPARTMENT Provider Note   CSN: [id number] Arrival date & time: 06/16/20  1735     History Chief Complaint  Patient presents with  . Vomiting    [name] [name] is a 50 y.o. female with a history including diabetes, hypertension, CAD and WPW s/p ablation therapy presenting for evaluation of nausea and vomiting along with  nonbloody diarrhea, poor appetite, lightheadedness and near syncope triggered by positional changes. Additionally she reports tachycardia with suspected dehydration.  She has tried to maintain p.o. intake with difficulty secondary to n/v.  She has been taking Zofran but ran out of this medication 3 days ago. She treated her diarrhea yesterday with imodium and this symptom has resolved.   Patient is employed here and tried to work today, unfortunately her symptoms returned including lightheadedness when standing after sitting, nausea, vomiting and near syncope.  She denies vertiginous sx.  She has had heart rate up to 150 bpm. Denies chest pain or shortness of breath, also has had no abdominal pain or distention.      HPI     Past Medical History:  Diagnosis Date  . Coronary artery disease   . Diabetes mellitus without complication (HCC)   . Hypertension   . Supraventricular arrhythmia     There are no problems to display for this patient.   Past Surgical History:  Procedure Laterality Date  . BREAST BIOPSY Left   . CARDIAC ELECTROPHYSIOLOGY STUDY AND ABLATION       OB History   No obstetric history on file.     No family history on file.  Social History   Tobacco Use  . Smoking status: Never Smoker  . Smokeless tobacco: Never Used  Substance Use Topics  . Alcohol use: No  . Drug use: No    Home Medications Prior to Admission medications   Medication Sig Start Date End Date Taking? Authorizing Provider  ondansetron (ZOFRAN ODT) 4 MG disintegrating tablet Take 1 tablet (4 mg total) by mouth every 8 (eight) hours as  needed for nausea or vomiting. 06/16/20  Yes Mayola Mcbain, Raynelle Fanning, PA-C  ondansetron (ZOFRAN) 4 MG tablet Take 1 tablet (4 mg total) by mouth every 8 (eight) hours as needed for nausea or vomiting. 06/16/20  Yes Jakeya Gherardi, Raynelle Fanning, PA-C  albuterol (PROVENTIL HFA;VENTOLIN HFA) 108 (90 BASE) MCG/ACT inhaler Inhale 2 puffs into the lungs every 6 (six) hours as needed for wheezing or shortness of breath.    [provider]  Continuous Blood Gluc Sensor (DEXCOM G6 SENSOR) MISC USE AS DIRECTED EVERY 10 DAYS 09/04/19 09/03/20  Leavy Cella, Amy H, PA-C  Continuous Blood Gluc Transmit (DEXCOM G6 TRANSMITTER) MISC USE AS DIRECTED 09/04/19 09/03/20  Leavy Cella, Amy H, PA-C  glipiZIDE (GLUCOTROL) 10 MG tablet TAKE 1 TABLET BY MOUTH ONCE DAILY FOR DIABETES 04/06/20 04/06/21  Leavy Cella, Amy H, PA-C  glipiZIDE (GLUCOTROL) 10 MG tablet TAKE 1 TABLET BY MOUTH ONCE DAILY FOR DIABETES 12/22/19 12/21/20  Juliette Alcide, MD  lisinopril-hydrochlorothiazide (PRINZIDE,ZESTORETIC) 20-25 MG per tablet Take 1 tablet by mouth daily.    [provider]  lisinopril-hydrochlorothiazide (ZESTORETIC) 20-25 MG tablet TAKE 1 TABLET BY MOUTH ONCE DAILY. 08/25/19 08/24/20  Leavy Cella, Amy H, PA-C  lisinopril-hydrochlorothiazide (ZESTORETIC) 20-25 MG tablet Take 1 tablet by mouth daily. 05/31/20     metFORMIN (GLUCOPHAGE-XR) 500 MG 24 hr tablet TAKE 4 TABLETS BY MOUTH EVERY DAY 03/30/20 03/30/21  Leavy Cella, Amy H, PA-C  metFORMIN (GLUCOPHAGE-XR) 500 MG 24 hr tablet TAKE 4 TABLETS BY  MOUTH ONCE DAILY. 08/25/19 08/24/20  Leavy Cella, Amy H, PA-C  Multiple Vitamin (MULTIVITAMIN WITH MINERALS) TABS tablet Take 1 tablet by mouth daily.    [provider]  oxyCODONE-acetaminophen (ROXICET) 5-325 MG per tablet Take 1-2 tablets by mouth every 4 (four) hours as needed for severe pain. 10/01/13   Janne Napoleon, NP  promethazine (PHENERGAN) 12.5 MG tablet Take 1 tablet (12.5 mg total) by mouth every 6 (six) hours as needed for nausea or vomiting. 10/01/13   Janne Napoleon, NP  rosuvastatin  (CRESTOR) 5 MG tablet TAKE 1 TABLET BY MOUTH EVERY EVENING 04/04/20 04/04/21  Leavy Cella, Amy H, PA-C  rosuvastatin (CRESTOR) 5 MG tablet TAKE 1 TABLET BY MOUTH EVERY EVENING 01/26/20 01/25/21  Boyd, Amy H, PA-C  Semaglutide, 1 MG/DOSE, 4 MG/3ML SOPN INJECT 1MG  INTO THE SKIN ONCE WEEKLY 04/08/20   04/10/20, Amy H, PA-C  Semaglutide,0.25 or 0.5MG /DOS, 2 MG/1.5ML SOPN INJECT 0.25MG  UNDER THE SKIN ONCE A WEEK FOR 4 WEEKS THEN INCREASE TO 0.5 MG ONCE A WEEK 08/27/19 08/26/20  10/26/20, Amy H, PA-C  sertraline (ZOLOFT) 100 MG tablet Take 100 mg by mouth daily.    [provider]  sertraline (ZOLOFT) 100 MG tablet Take 1.5 tablets (150 mg total) by mouth daily. 06/10/20       Allergies    Codeine  Review of Systems   Review of Systems  Constitutional: Negative for chills and fever.  HENT: Negative.   Eyes: Negative.   Respiratory: Negative for chest tightness and shortness of breath.   Cardiovascular: Positive for palpitations. Negative for chest pain.  Gastrointestinal: Positive for diarrhea, nausea and vomiting. Negative for abdominal pain.  Genitourinary: Negative.   Musculoskeletal: Negative for arthralgias, joint swelling and neck pain.  Skin: Negative.  Negative for rash and wound.  Neurological: Positive for light-headedness. Negative for dizziness, weakness, numbness and headaches.       Near syncope  Psychiatric/Behavioral: Negative.   All other systems reviewed and are negative.   Physical Exam Updated Vital Signs BP 117/69   Pulse 79   Temp 98 F (36.7 C) (Oral)   Resp 14   Ht 5\' 4"  (1.626 m)   SpO2 98%   BMI 39.48 kg/m   Physical Exam Vitals and nursing note reviewed.  Constitutional:      Appearance: She is well-developed.  HENT:     Head: Normocephalic and atraumatic.  Eyes:     Conjunctiva/sclera: Conjunctivae normal.  Cardiovascular:     Rate and Rhythm: Normal rate and regular rhythm.     Heart sounds: Normal heart sounds.  Pulmonary:     Effort: Pulmonary effort is  normal.     Breath sounds: Normal breath sounds. No wheezing.  Abdominal:     General: Bowel sounds are normal. There is no distension.     Palpations: Abdomen is soft.     Tenderness: There is no abdominal tenderness. There is no guarding or rebound.  Musculoskeletal:        General: Normal range of motion.     Cervical back: Normal range of motion.  Skin:    General: Skin is warm and dry.  Neurological:     Mental Status: She is alert.     ED Results / Procedures / Treatments   Labs (all labs ordered are listed, but only abnormal results are displayed) Labs Reviewed  CBC - Abnormal; Notable for the following components:      Result Value   WBC 12.2 (*)  Hemoglobin 15.3 (*)    All other components within normal limits  COMPREHENSIVE METABOLIC PANEL - Abnormal; Notable for the following components:   Sodium 134 (*)    Glucose, Bld 207 (*)    Calcium 8.7 (*)    All other components within normal limits  URINALYSIS, ROUTINE W REFLEX MICROSCOPIC - Abnormal; Notable for the following components:   Ketones, ur 5 (*)    Protein, ur 30 (*)    Bacteria, UA RARE (*)    All other components within normal limits  URINE CULTURE  TROPONIN I (HIGH SENSITIVITY)  TROPONIN I (HIGH SENSITIVITY)    EKG EKG Interpretation  Date/Time:  Thursday June 16 2020 18:15:25 EDT Ventricular Rate:  86 PR Interval:  123 QRS Duration: 98 QT Interval:  361 QTC Calculation: 432 R Axis:   77 Text Interpretation: Unknown rhythm, irregular rate Low voltage, precordial leads Borderline T abnormalities, anterior leads Confirmed by Bethann Berkshire [phone]) on 06/16/2020 11:21:56 PM  ekg - sinus rhythm with occasional PAC's, reviewed by Dr. Estell Harpin    Radiology No results found.  Procedures Procedures   Medications Ordered in ED Medications  sodium chloride 0.9 % bolus 1,000 mL (0 mLs Intravenous Stopped 06/16/20 2059)  ondansetron (ZOFRAN) injection 4 mg (4 mg Intravenous Given 06/16/20 2000)     ED Course  I have reviewed the triage vital signs and the nursing notes.  Pertinent labs & imaging results that were available during my care of the patient were reviewed by me and considered in my medical decision making (see chart for details).    MDM Rules/Calculators/A&P                          Pt with nausea/vomiting/diarrhea and lightheadedness with positional changes. No chest pain, sob, no abdominal pain.  History and exam suggesting gastroenteritis with resultant dehydration.  CBG elevated at 207 but no anion gap.  She does have ketonuria, rare bacteria, no nitrites.  Urine culture ordered, uti unlikely.  Elevated wbc and hgb, most likely hemoconcentrated secodnary to dehydration.  VSS.  Pt was not orthostatic after IV fluids given and felt improved.  Delta troponins are negative, ekg reassuring.  PT was prescribed additional zofran, pre pack given as well.  Advised rest, increased fluid intake, take imodium only if diarrhea returns. Bland diet x 24 hours, then increase as tolerated.   Final Clinical Impression(s) / ED Diagnoses Final diagnoses:  Viral gastroenteritis    Rx / DC Orders ED Discharge Orders         Ordered    ondansetron (ZOFRAN) 4 MG tablet  Every 8 hours PRN        06/16/20 2332    ondansetron (ZOFRAN ODT) 4 MG disintegrating tablet  Every 8 hours PRN        06/16/20 2333           Burgess Amor, PA-C 06/17/20 1056    Bethann Berkshire, MD 06/17/20 2244

## 2020-06-18 LAB — URINE CULTURE: Culture: <10000 — AB

## 2020-07-05 DIAGNOSIS — H5203 Hypermetropia, bilateral: Secondary | ICD-10-CM | POA: Diagnosis not present

## 2020-07-06 ENCOUNTER — Other Ambulatory Visit (HOSPITAL_COMMUNITY): Payer: Self-pay

## 2020-07-06 MED ORDER — METFORMIN HCL ER 500 MG PO TB24
2000.0000 mg | ORAL_TABLET | Freq: Every day | ORAL | 0 refills | Status: DC
  Filled 2020-07-06: qty 360, 90d supply, fill #0

## 2020-07-08 ENCOUNTER — Other Ambulatory Visit (HOSPITAL_COMMUNITY): Payer: Self-pay

## 2020-07-08 MED FILL — Rosuvastatin Calcium Tab 5 MG: ORAL | 30 days supply | Qty: 30 | Fill #2 | Status: AC

## 2020-07-08 MED FILL — Glipizide Tab 10 MG: ORAL | 90 days supply | Qty: 90 | Fill #0 | Status: AC

## 2020-07-14 ENCOUNTER — Other Ambulatory Visit (HOSPITAL_COMMUNITY): Payer: Self-pay

## 2020-07-14 MED FILL — Continuous Glucose System Sensor: 30 days supply | Qty: 3 | Fill #1 | Status: AC

## 2020-07-19 DIAGNOSIS — G43909 Migraine, unspecified, not intractable, without status migrainosus: Secondary | ICD-10-CM | POA: Diagnosis not present

## 2020-07-19 DIAGNOSIS — E119 Type 2 diabetes mellitus without complications: Secondary | ICD-10-CM | POA: Diagnosis not present

## 2020-07-19 DIAGNOSIS — I10 Essential (primary) hypertension: Secondary | ICD-10-CM | POA: Diagnosis not present

## 2020-07-19 DIAGNOSIS — Z6833 Body mass index (BMI) 33.0-33.9, adult: Secondary | ICD-10-CM | POA: Diagnosis not present

## 2020-07-19 DIAGNOSIS — E7849 Other hyperlipidemia: Secondary | ICD-10-CM | POA: Diagnosis not present

## 2020-07-19 DIAGNOSIS — F321 Major depressive disorder, single episode, moderate: Secondary | ICD-10-CM | POA: Diagnosis not present

## 2020-08-12 ENCOUNTER — Other Ambulatory Visit (HOSPITAL_COMMUNITY): Payer: Self-pay

## 2020-08-12 MED FILL — Continuous Glucose System Transmitter: 90 days supply | Qty: 1 | Fill #1 | Status: CN

## 2020-08-16 ENCOUNTER — Other Ambulatory Visit (HOSPITAL_COMMUNITY): Payer: Self-pay

## 2020-08-16 MED ORDER — ROSUVASTATIN CALCIUM 5 MG PO TABS
5.0000 mg | ORAL_TABLET | Freq: Every evening | ORAL | 3 refills | Status: DC
  Filled 2020-08-16: qty 30, 30d supply, fill #0
  Filled 2020-09-26: qty 30, 30d supply, fill #1
  Filled 2020-11-02: qty 60, 60d supply, fill #2

## 2020-08-19 ENCOUNTER — Other Ambulatory Visit (HOSPITAL_COMMUNITY): Payer: Self-pay

## 2020-08-19 MED FILL — Continuous Glucose System Sensor: 30 days supply | Qty: 3 | Fill #2 | Status: AC

## 2020-08-26 ENCOUNTER — Other Ambulatory Visit (HOSPITAL_COMMUNITY): Payer: Self-pay

## 2020-08-26 MED ORDER — CARESTART COVID-19 HOME TEST VI KIT
PACK | 0 refills | Status: AC
  Filled 2020-08-26: qty 2, 2d supply, fill #0

## 2020-08-26 MED ORDER — SERTRALINE HCL 100 MG PO TABS
200.0000 mg | ORAL_TABLET | Freq: Every day | ORAL | 0 refills | Status: AC
  Filled 2020-08-26: qty 180, 90d supply, fill #0

## 2020-08-26 MED ORDER — LISINOPRIL-HYDROCHLOROTHIAZIDE 20-25 MG PO TABS
1.0000 | ORAL_TABLET | Freq: Every day | ORAL | 0 refills | Status: AC
  Filled 2020-08-26: qty 90, 90d supply, fill #0

## 2020-08-26 MED FILL — Continuous Glucose System Transmitter: 90 days supply | Qty: 1 | Fill #1 | Status: AC

## 2020-09-26 ENCOUNTER — Other Ambulatory Visit (HOSPITAL_COMMUNITY): Payer: Self-pay

## 2020-09-27 ENCOUNTER — Other Ambulatory Visit (HOSPITAL_COMMUNITY): Payer: Self-pay

## 2020-09-28 ENCOUNTER — Other Ambulatory Visit (HOSPITAL_COMMUNITY): Payer: Self-pay

## 2020-09-28 MED ORDER — SEMAGLUTIDE (1 MG/DOSE) 4 MG/3ML ~~LOC~~ SOPN
1.0000 mg | PEN_INJECTOR | SUBCUTANEOUS | 0 refills | Status: DC
  Filled 2020-09-28: qty 9, 84d supply, fill #0
  Filled 2020-09-28: qty 3, 28d supply, fill #0

## 2020-10-13 ENCOUNTER — Other Ambulatory Visit (HOSPITAL_COMMUNITY): Payer: Self-pay

## 2020-10-14 ENCOUNTER — Other Ambulatory Visit (HOSPITAL_COMMUNITY): Payer: Self-pay

## 2020-10-18 MED ORDER — METFORMIN HCL ER 500 MG PO TB24
2000.0000 mg | ORAL_TABLET | Freq: Every day | ORAL | 0 refills | Status: DC
  Filled 2020-10-18: qty 360, 90d supply, fill #0

## 2020-10-19 ENCOUNTER — Other Ambulatory Visit (HOSPITAL_COMMUNITY): Payer: Self-pay

## 2020-11-02 ENCOUNTER — Other Ambulatory Visit (HOSPITAL_COMMUNITY): Payer: Self-pay

## 2020-11-29 ENCOUNTER — Other Ambulatory Visit (HOSPITAL_COMMUNITY): Payer: Self-pay

## 2020-11-29 DIAGNOSIS — I10 Essential (primary) hypertension: Secondary | ICD-10-CM | POA: Diagnosis not present

## 2020-11-29 DIAGNOSIS — E7849 Other hyperlipidemia: Secondary | ICD-10-CM | POA: Diagnosis not present

## 2020-11-29 DIAGNOSIS — F321 Major depressive disorder, single episode, moderate: Secondary | ICD-10-CM | POA: Diagnosis not present

## 2020-11-29 DIAGNOSIS — E119 Type 2 diabetes mellitus without complications: Secondary | ICD-10-CM | POA: Diagnosis not present

## 2020-11-29 DIAGNOSIS — G43909 Migraine, unspecified, not intractable, without status migrainosus: Secondary | ICD-10-CM | POA: Diagnosis not present

## 2020-11-29 MED ORDER — SERTRALINE HCL 100 MG PO TABS
200.0000 mg | ORAL_TABLET | Freq: Every day | ORAL | 0 refills | Status: DC
  Filled 2020-11-29: qty 180, 90d supply, fill #0

## 2020-11-29 MED ORDER — GLIPIZIDE 10 MG PO TABS
5.0000 mg | ORAL_TABLET | Freq: Every day | ORAL | 1 refills | Status: DC
  Filled 2020-11-29: qty 45, 90d supply, fill #0
  Filled 2021-02-23: qty 45, 90d supply, fill #1

## 2020-11-29 MED ORDER — LISINOPRIL-HYDROCHLOROTHIAZIDE 20-25 MG PO TABS
1.0000 | ORAL_TABLET | Freq: Every day | ORAL | 0 refills | Status: DC
  Filled 2020-11-29: qty 90, 90d supply, fill #0

## 2020-11-29 MED ORDER — MEPERIDINE HCL 50 MG PO TABS
50.0000 mg | ORAL_TABLET | ORAL | 0 refills | Status: AC
  Filled 2020-11-29: qty 5, 5d supply, fill #0
  Filled 2020-11-29: qty 5, 30d supply, fill #0

## 2020-12-01 ENCOUNTER — Other Ambulatory Visit (HOSPITAL_COMMUNITY): Payer: Self-pay

## 2021-01-03 DIAGNOSIS — J45901 Unspecified asthma with (acute) exacerbation: Secondary | ICD-10-CM | POA: Diagnosis not present

## 2021-01-03 DIAGNOSIS — J40 Bronchitis, not specified as acute or chronic: Secondary | ICD-10-CM | POA: Diagnosis not present

## 2021-01-18 ENCOUNTER — Other Ambulatory Visit (HOSPITAL_COMMUNITY): Payer: Self-pay

## 2021-01-18 MED ORDER — ROSUVASTATIN CALCIUM 5 MG PO TABS
5.0000 mg | ORAL_TABLET | Freq: Every evening | ORAL | 3 refills | Status: DC
  Filled 2021-01-18: qty 90, 90d supply, fill #0

## 2021-01-23 ENCOUNTER — Other Ambulatory Visit (HOSPITAL_COMMUNITY): Payer: Self-pay

## 2021-01-24 ENCOUNTER — Other Ambulatory Visit (HOSPITAL_COMMUNITY): Payer: Self-pay

## 2021-01-24 MED ORDER — METFORMIN HCL ER 500 MG PO TB24
2000.0000 mg | ORAL_TABLET | Freq: Every day | ORAL | 0 refills | Status: DC
  Filled 2021-01-24: qty 360, 90d supply, fill #0

## 2021-02-23 ENCOUNTER — Other Ambulatory Visit (HOSPITAL_COMMUNITY): Payer: Self-pay

## 2021-02-24 ENCOUNTER — Other Ambulatory Visit (HOSPITAL_COMMUNITY): Payer: Self-pay

## 2021-03-06 ENCOUNTER — Other Ambulatory Visit (HOSPITAL_COMMUNITY): Payer: Self-pay

## 2021-03-07 ENCOUNTER — Other Ambulatory Visit (HOSPITAL_COMMUNITY): Payer: Self-pay

## 2021-03-07 MED ORDER — SERTRALINE HCL 100 MG PO TABS
200.0000 mg | ORAL_TABLET | Freq: Every day | ORAL | 0 refills | Status: DC
  Filled 2021-03-07: qty 180, 90d supply, fill #0

## 2021-03-07 MED ORDER — LISINOPRIL-HYDROCHLOROTHIAZIDE 20-25 MG PO TABS
1.0000 | ORAL_TABLET | Freq: Every day | ORAL | 0 refills | Status: DC
  Filled 2021-03-07: qty 90, 90d supply, fill #0

## 2021-04-27 ENCOUNTER — Other Ambulatory Visit (HOSPITAL_COMMUNITY): Payer: Self-pay

## 2021-04-27 MED ORDER — METFORMIN HCL ER 500 MG PO TB24
2000.0000 mg | ORAL_TABLET | Freq: Every day | ORAL | 0 refills | Status: DC
  Filled 2021-04-27: qty 360, 90d supply, fill #0

## 2021-04-27 MED ORDER — ROSUVASTATIN CALCIUM 5 MG PO TABS
5.0000 mg | ORAL_TABLET | Freq: Every evening | ORAL | 0 refills | Status: DC
  Filled 2021-04-27: qty 30, 30d supply, fill #0

## 2021-04-28 ENCOUNTER — Other Ambulatory Visit (HOSPITAL_COMMUNITY): Payer: Self-pay

## 2021-05-30 ENCOUNTER — Other Ambulatory Visit (HOSPITAL_COMMUNITY): Payer: Self-pay

## 2021-05-30 MED ORDER — ROSUVASTATIN CALCIUM 5 MG PO TABS
5.0000 mg | ORAL_TABLET | Freq: Every evening | ORAL | 0 refills | Status: DC
  Filled 2021-05-30: qty 30, 30d supply, fill #0

## 2021-05-30 MED ORDER — LISINOPRIL-HYDROCHLOROTHIAZIDE 20-25 MG PO TABS
1.0000 | ORAL_TABLET | Freq: Every day | ORAL | 0 refills | Status: DC
  Filled 2021-05-30: qty 90, 90d supply, fill #0

## 2021-05-30 MED ORDER — SEMAGLUTIDE (1 MG/DOSE) 4 MG/3ML ~~LOC~~ SOPN
1.0000 mg | PEN_INJECTOR | SUBCUTANEOUS | 0 refills | Status: AC
  Filled 2021-05-30: qty 9, 84d supply, fill #0

## 2021-05-30 MED ORDER — GLIPIZIDE 10 MG PO TABS
5.0000 mg | ORAL_TABLET | Freq: Every day | ORAL | 0 refills | Status: DC
  Filled 2021-05-30: qty 45, 90d supply, fill #0

## 2021-05-31 ENCOUNTER — Other Ambulatory Visit (HOSPITAL_COMMUNITY): Payer: Self-pay

## 2021-05-31 DIAGNOSIS — I10 Essential (primary) hypertension: Secondary | ICD-10-CM | POA: Diagnosis not present

## 2021-05-31 DIAGNOSIS — E7849 Other hyperlipidemia: Secondary | ICD-10-CM | POA: Diagnosis not present

## 2021-05-31 DIAGNOSIS — E782 Mixed hyperlipidemia: Secondary | ICD-10-CM | POA: Diagnosis not present

## 2021-05-31 DIAGNOSIS — Z6834 Body mass index (BMI) 34.0-34.9, adult: Secondary | ICD-10-CM | POA: Diagnosis not present

## 2021-05-31 DIAGNOSIS — E119 Type 2 diabetes mellitus without complications: Secondary | ICD-10-CM | POA: Diagnosis not present

## 2021-05-31 DIAGNOSIS — F321 Major depressive disorder, single episode, moderate: Secondary | ICD-10-CM | POA: Diagnosis not present

## 2021-05-31 DIAGNOSIS — G43909 Migraine, unspecified, not intractable, without status migrainosus: Secondary | ICD-10-CM | POA: Diagnosis not present

## 2021-05-31 MED ORDER — LISINOPRIL-HYDROCHLOROTHIAZIDE 20-25 MG PO TABS
1.0000 | ORAL_TABLET | Freq: Every day | ORAL | 0 refills | Status: AC
  Filled 2021-05-31: qty 90, 90d supply, fill #0

## 2021-06-13 ENCOUNTER — Other Ambulatory Visit (HOSPITAL_COMMUNITY): Payer: Self-pay

## 2021-06-13 MED ORDER — SERTRALINE HCL 100 MG PO TABS
200.0000 mg | ORAL_TABLET | Freq: Every day | ORAL | 0 refills | Status: AC
  Filled 2021-06-13: qty 180, 90d supply, fill #0

## 2021-07-04 ENCOUNTER — Other Ambulatory Visit (HOSPITAL_COMMUNITY): Payer: Self-pay

## 2021-07-04 MED ORDER — ROSUVASTATIN CALCIUM 5 MG PO TABS
5.0000 mg | ORAL_TABLET | Freq: Every evening | ORAL | 3 refills | Status: DC
  Filled 2021-07-04: qty 90, 90d supply, fill #0
  Filled 2022-01-30: qty 30, 30d supply, fill #1

## 2021-08-07 ENCOUNTER — Other Ambulatory Visit (HOSPITAL_COMMUNITY): Payer: Self-pay

## 2021-08-07 MED ORDER — METFORMIN HCL ER 500 MG PO TB24
2000.0000 mg | ORAL_TABLET | Freq: Every day | ORAL | 0 refills | Status: DC
  Filled 2021-08-07: qty 360, 90d supply, fill #0

## 2021-08-07 MED ORDER — GLIPIZIDE 10 MG PO TABS
5.0000 mg | ORAL_TABLET | Freq: Every day | ORAL | 0 refills | Status: DC
  Filled 2021-08-07 – 2021-08-14 (×3): qty 45, 90d supply, fill #0

## 2021-08-08 ENCOUNTER — Other Ambulatory Visit (HOSPITAL_COMMUNITY): Payer: Self-pay

## 2021-08-09 ENCOUNTER — Other Ambulatory Visit (HOSPITAL_COMMUNITY): Payer: Self-pay

## 2021-08-14 ENCOUNTER — Other Ambulatory Visit (HOSPITAL_COMMUNITY): Payer: Self-pay

## 2021-09-05 ENCOUNTER — Other Ambulatory Visit (HOSPITAL_COMMUNITY): Payer: Self-pay

## 2021-09-05 MED ORDER — LISINOPRIL-HYDROCHLOROTHIAZIDE 20-25 MG PO TABS
1.0000 | ORAL_TABLET | Freq: Every day | ORAL | 0 refills | Status: DC
  Filled 2021-09-05: qty 90, 90d supply, fill #0

## 2021-09-12 ENCOUNTER — Other Ambulatory Visit (HOSPITAL_COMMUNITY): Payer: Self-pay

## 2021-09-12 DIAGNOSIS — Z6834 Body mass index (BMI) 34.0-34.9, adult: Secondary | ICD-10-CM | POA: Diagnosis not present

## 2021-09-12 DIAGNOSIS — I10 Essential (primary) hypertension: Secondary | ICD-10-CM | POA: Diagnosis not present

## 2021-09-12 DIAGNOSIS — E7849 Other hyperlipidemia: Secondary | ICD-10-CM | POA: Diagnosis not present

## 2021-09-12 DIAGNOSIS — G43909 Migraine, unspecified, not intractable, without status migrainosus: Secondary | ICD-10-CM | POA: Diagnosis not present

## 2021-09-12 DIAGNOSIS — E782 Mixed hyperlipidemia: Secondary | ICD-10-CM | POA: Diagnosis not present

## 2021-09-12 DIAGNOSIS — E119 Type 2 diabetes mellitus without complications: Secondary | ICD-10-CM | POA: Diagnosis not present

## 2021-09-12 DIAGNOSIS — F321 Major depressive disorder, single episode, moderate: Secondary | ICD-10-CM | POA: Diagnosis not present

## 2021-09-12 MED ORDER — NURTEC 75 MG PO TBDP
75.0000 mg | ORAL_TABLET | Freq: Every day | ORAL | 0 refills | Status: AC | PRN
  Filled 2021-09-12: qty 8, 30d supply, fill #0

## 2021-09-12 MED ORDER — METFORMIN HCL ER 500 MG PO TB24
2000.0000 mg | ORAL_TABLET | Freq: Every day | ORAL | 0 refills | Status: DC
  Filled 2021-09-12 – 2021-11-16 (×2): qty 360, 90d supply, fill #0

## 2021-09-12 MED ORDER — SERTRALINE HCL 100 MG PO TABS
200.0000 mg | ORAL_TABLET | Freq: Every day | ORAL | 0 refills | Status: DC
  Filled 2021-09-12: qty 180, 90d supply, fill #0

## 2021-09-12 MED ORDER — ROSUVASTATIN CALCIUM 5 MG PO TABS
5.0000 mg | ORAL_TABLET | Freq: Every evening | ORAL | 0 refills | Status: AC
  Filled 2021-09-12: qty 90, 90d supply, fill #0

## 2021-09-12 MED ORDER — OZEMPIC (1 MG/DOSE) 4 MG/3ML ~~LOC~~ SOPN
1.0000 mg | PEN_INJECTOR | SUBCUTANEOUS | 0 refills | Status: AC
  Filled 2021-09-12: qty 9, 84d supply, fill #0

## 2021-09-13 ENCOUNTER — Other Ambulatory Visit (HOSPITAL_COMMUNITY): Payer: Self-pay

## 2021-09-14 ENCOUNTER — Other Ambulatory Visit (HOSPITAL_COMMUNITY): Payer: Self-pay

## 2021-09-19 ENCOUNTER — Other Ambulatory Visit (HOSPITAL_COMMUNITY): Payer: Self-pay

## 2021-10-05 ENCOUNTER — Other Ambulatory Visit (HOSPITAL_COMMUNITY): Payer: Self-pay

## 2021-10-09 ENCOUNTER — Other Ambulatory Visit (HOSPITAL_COMMUNITY): Payer: Self-pay

## 2021-10-09 MED ORDER — DEXCOM G4 PLATINUM RECEIVER DEVI: 0 refills | Status: AC

## 2021-10-09 MED ORDER — DEXCOM G5 MOB/G4 PLAT SENSOR MISC: 12 refills | Status: AC

## 2021-10-10 ENCOUNTER — Other Ambulatory Visit (HOSPITAL_COMMUNITY): Payer: Self-pay

## 2021-10-11 ENCOUNTER — Other Ambulatory Visit (HOSPITAL_COMMUNITY): Payer: Self-pay

## 2021-10-11 MED ORDER — DEXCOM G6 SENSOR MISC
12 refills | Status: AC
  Filled 2021-10-11 – 2021-10-23 (×2): qty 3, 30d supply, fill #0
  Filled 2022-06-04: qty 3, 30d supply, fill #1

## 2021-10-11 MED ORDER — DEXCOM G6 TRANSMITTER MISC
0 refills | Status: DC
  Filled 2021-10-11 – 2021-10-23 (×2): qty 1, 90d supply, fill #0

## 2021-10-23 ENCOUNTER — Other Ambulatory Visit (HOSPITAL_COMMUNITY): Payer: Self-pay

## 2021-10-31 ENCOUNTER — Other Ambulatory Visit (HOSPITAL_COMMUNITY): Payer: Self-pay

## 2021-11-03 ENCOUNTER — Other Ambulatory Visit (HOSPITAL_COMMUNITY): Payer: Self-pay

## 2021-11-06 ENCOUNTER — Other Ambulatory Visit (HOSPITAL_COMMUNITY): Payer: Self-pay

## 2021-11-07 ENCOUNTER — Other Ambulatory Visit (HOSPITAL_COMMUNITY): Payer: Self-pay

## 2021-11-07 MED ORDER — GLIPIZIDE 10 MG PO TABS
5.0000 mg | ORAL_TABLET | Freq: Every day | ORAL | 0 refills | Status: DC
  Filled 2021-11-07: qty 45, 90d supply, fill #0

## 2021-11-16 ENCOUNTER — Other Ambulatory Visit (HOSPITAL_COMMUNITY): Payer: Self-pay

## 2021-11-29 DIAGNOSIS — J0100 Acute maxillary sinusitis, unspecified: Secondary | ICD-10-CM | POA: Diagnosis not present

## 2021-12-12 ENCOUNTER — Other Ambulatory Visit (HOSPITAL_COMMUNITY): Payer: Self-pay

## 2021-12-13 ENCOUNTER — Other Ambulatory Visit (HOSPITAL_COMMUNITY): Payer: Self-pay

## 2021-12-13 MED ORDER — LISINOPRIL-HYDROCHLOROTHIAZIDE 20-25 MG PO TABS
1.0000 | ORAL_TABLET | Freq: Every day | ORAL | 1 refills | Status: DC
  Filled 2021-12-13 – 2022-03-23 (×2): qty 90, 90d supply, fill #0

## 2021-12-18 ENCOUNTER — Other Ambulatory Visit (HOSPITAL_COMMUNITY): Payer: Self-pay

## 2021-12-19 DIAGNOSIS — R059 Cough, unspecified: Secondary | ICD-10-CM | POA: Diagnosis not present

## 2021-12-19 DIAGNOSIS — R030 Elevated blood-pressure reading, without diagnosis of hypertension: Secondary | ICD-10-CM | POA: Diagnosis not present

## 2021-12-19 DIAGNOSIS — Z6831 Body mass index (BMI) 31.0-31.9, adult: Secondary | ICD-10-CM | POA: Diagnosis not present

## 2021-12-19 DIAGNOSIS — R509 Fever, unspecified: Secondary | ICD-10-CM | POA: Diagnosis not present

## 2022-01-04 ENCOUNTER — Other Ambulatory Visit (HOSPITAL_COMMUNITY): Payer: Self-pay

## 2022-01-09 ENCOUNTER — Other Ambulatory Visit (HOSPITAL_COMMUNITY): Payer: Self-pay

## 2022-01-09 MED ORDER — SERTRALINE HCL 100 MG PO TABS
200.0000 mg | ORAL_TABLET | Freq: Every day | ORAL | 0 refills | Status: DC
  Filled 2022-01-09: qty 180, 90d supply, fill #0

## 2022-01-10 ENCOUNTER — Other Ambulatory Visit (HOSPITAL_COMMUNITY): Payer: Self-pay

## 2022-01-30 ENCOUNTER — Other Ambulatory Visit (HOSPITAL_COMMUNITY): Payer: Self-pay

## 2022-01-30 MED ORDER — GLIPIZIDE 10 MG PO TABS
5.0000 mg | ORAL_TABLET | Freq: Every day | ORAL | 0 refills | Status: DC
  Filled 2022-01-30: qty 45, 90d supply, fill #0

## 2022-03-08 ENCOUNTER — Other Ambulatory Visit (HOSPITAL_COMMUNITY): Payer: Self-pay

## 2022-03-12 MED ORDER — METFORMIN HCL ER 500 MG PO TB24
2000.0000 mg | ORAL_TABLET | Freq: Every day | ORAL | 3 refills | Status: DC
  Filled 2022-03-12: qty 360, 90d supply, fill #0
  Filled 2022-06-21 (×2): qty 360, 90d supply, fill #1
  Filled 2022-10-11: qty 360, 90d supply, fill #2
  Filled 2023-03-05: qty 360, 90d supply, fill #3

## 2022-03-12 MED ORDER — ROSUVASTATIN CALCIUM 5 MG PO TABS
5.0000 mg | ORAL_TABLET | Freq: Every evening | ORAL | 3 refills | Status: DC
  Filled 2022-03-12: qty 30, 30d supply, fill #0
  Filled 2022-10-03: qty 30, 30d supply, fill #1
  Filled 2023-02-06: qty 30, 30d supply, fill #2

## 2022-03-13 ENCOUNTER — Other Ambulatory Visit (HOSPITAL_COMMUNITY): Payer: Self-pay

## 2022-03-13 DIAGNOSIS — Z6833 Body mass index (BMI) 33.0-33.9, adult: Secondary | ICD-10-CM | POA: Diagnosis not present

## 2022-03-13 DIAGNOSIS — F321 Major depressive disorder, single episode, moderate: Secondary | ICD-10-CM | POA: Diagnosis not present

## 2022-03-13 DIAGNOSIS — G43909 Migraine, unspecified, not intractable, without status migrainosus: Secondary | ICD-10-CM | POA: Diagnosis not present

## 2022-03-13 DIAGNOSIS — E782 Mixed hyperlipidemia: Secondary | ICD-10-CM | POA: Diagnosis not present

## 2022-03-13 DIAGNOSIS — E119 Type 2 diabetes mellitus without complications: Secondary | ICD-10-CM | POA: Diagnosis not present

## 2022-03-13 DIAGNOSIS — I10 Essential (primary) hypertension: Secondary | ICD-10-CM | POA: Diagnosis not present

## 2022-03-13 MED ORDER — ROSUVASTATIN CALCIUM 5 MG PO TABS
5.0000 mg | ORAL_TABLET | Freq: Every evening | ORAL | 3 refills | Status: AC
  Filled 2022-03-13 – 2022-12-03 (×2): qty 30, 30d supply, fill #0

## 2022-03-16 ENCOUNTER — Other Ambulatory Visit (HOSPITAL_COMMUNITY): Payer: Self-pay

## 2022-03-22 ENCOUNTER — Other Ambulatory Visit (HOSPITAL_COMMUNITY): Payer: Self-pay

## 2022-03-22 MED ORDER — OZEMPIC (2 MG/DOSE) 8 MG/3ML ~~LOC~~ SOPN
2.0000 mg | PEN_INJECTOR | SUBCUTANEOUS | 1 refills | Status: AC
  Filled 2022-03-22: qty 3, 28d supply, fill #0

## 2022-03-22 MED ORDER — ROSUVASTATIN CALCIUM 10 MG PO TABS
10.0000 mg | ORAL_TABLET | Freq: Every evening | ORAL | 0 refills | Status: AC
  Filled 2022-03-22 – 2022-04-05 (×2): qty 90, 90d supply, fill #0

## 2022-03-23 ENCOUNTER — Other Ambulatory Visit: Payer: Self-pay

## 2022-03-23 ENCOUNTER — Other Ambulatory Visit (HOSPITAL_COMMUNITY): Payer: Self-pay

## 2022-04-05 ENCOUNTER — Other Ambulatory Visit (HOSPITAL_COMMUNITY): Payer: Self-pay

## 2022-04-05 ENCOUNTER — Other Ambulatory Visit: Payer: Self-pay

## 2022-04-19 ENCOUNTER — Other Ambulatory Visit (HOSPITAL_COMMUNITY): Payer: Self-pay

## 2022-04-21 ENCOUNTER — Other Ambulatory Visit (HOSPITAL_COMMUNITY): Payer: Self-pay

## 2022-04-21 MED ORDER — GLIPIZIDE 10 MG PO TABS
5.0000 mg | ORAL_TABLET | Freq: Every day | ORAL | 0 refills | Status: AC
  Filled 2022-04-21 – 2022-04-26 (×3): qty 45, 90d supply, fill #0

## 2022-04-26 ENCOUNTER — Other Ambulatory Visit: Payer: Self-pay

## 2022-04-27 ENCOUNTER — Other Ambulatory Visit (HOSPITAL_COMMUNITY): Payer: Self-pay

## 2022-04-30 ENCOUNTER — Other Ambulatory Visit (HOSPITAL_COMMUNITY): Payer: Self-pay

## 2022-05-01 ENCOUNTER — Other Ambulatory Visit (HOSPITAL_COMMUNITY): Payer: Self-pay

## 2022-05-01 ENCOUNTER — Other Ambulatory Visit: Payer: Self-pay

## 2022-05-01 MED ORDER — SERTRALINE HCL 100 MG PO TABS
200.0000 mg | ORAL_TABLET | Freq: Every day | ORAL | 0 refills | Status: AC
  Filled 2022-05-01: qty 180, 90d supply, fill #0

## 2022-06-04 ENCOUNTER — Other Ambulatory Visit (HOSPITAL_COMMUNITY): Payer: Self-pay

## 2022-06-04 ENCOUNTER — Other Ambulatory Visit: Payer: Self-pay

## 2022-06-04 MED ORDER — DEXCOM G6 TRANSMITTER MISC
3 refills | Status: AC
  Filled 2022-06-04: qty 1, 90d supply, fill #0

## 2022-06-21 ENCOUNTER — Other Ambulatory Visit (HOSPITAL_COMMUNITY): Payer: Self-pay

## 2022-06-22 ENCOUNTER — Other Ambulatory Visit (HOSPITAL_COMMUNITY): Payer: Self-pay

## 2022-06-23 ENCOUNTER — Other Ambulatory Visit (HOSPITAL_COMMUNITY): Payer: Self-pay

## 2022-06-26 ENCOUNTER — Other Ambulatory Visit (HOSPITAL_COMMUNITY): Payer: Self-pay

## 2022-06-26 ENCOUNTER — Other Ambulatory Visit: Payer: Self-pay

## 2022-06-26 MED ORDER — LISINOPRIL-HYDROCHLOROTHIAZIDE 20-25 MG PO TABS
1.0000 | ORAL_TABLET | Freq: Every day | ORAL | 0 refills | Status: DC
  Filled 2022-06-26: qty 90, 90d supply, fill #0

## 2022-07-27 ENCOUNTER — Other Ambulatory Visit (HOSPITAL_COMMUNITY): Payer: Self-pay

## 2022-07-27 MED ORDER — ONDANSETRON 8 MG PO TBDP
8.0000 mg | ORAL_TABLET | Freq: Three times a day (TID) | ORAL | 1 refills | Status: DC | PRN
  Filled 2022-07-27 – 2022-08-06 (×2): qty 30, 10d supply, fill #0
  Filled 2022-10-03: qty 30, 10d supply, fill #1

## 2022-07-27 MED ORDER — OZEMPIC (2 MG/DOSE) 8 MG/3ML ~~LOC~~ SOPN
2.0000 mg | PEN_INJECTOR | SUBCUTANEOUS | 1 refills | Status: DC
Start: 2022-07-26 — End: 2023-01-07
  Filled 2022-07-27 – 2022-08-06 (×2): qty 3, 28d supply, fill #0
  Filled 2022-10-03: qty 3, 28d supply, fill #1

## 2022-07-27 MED ORDER — ROSUVASTATIN CALCIUM 5 MG PO TABS
5.0000 mg | ORAL_TABLET | Freq: Every evening | ORAL | 0 refills | Status: AC
  Filled 2022-07-27 – 2022-08-06 (×2): qty 30, 30d supply, fill #0

## 2022-07-27 MED ORDER — GLIPIZIDE 10 MG PO TABS
5.0000 mg | ORAL_TABLET | Freq: Every day | ORAL | 0 refills | Status: DC
  Filled 2022-07-27 – 2022-08-06 (×2): qty 45, 90d supply, fill #0

## 2022-07-30 ENCOUNTER — Other Ambulatory Visit: Payer: Self-pay

## 2022-08-02 ENCOUNTER — Other Ambulatory Visit: Payer: Self-pay

## 2022-08-06 ENCOUNTER — Other Ambulatory Visit (HOSPITAL_COMMUNITY): Payer: Self-pay

## 2022-08-15 ENCOUNTER — Other Ambulatory Visit: Payer: Self-pay

## 2022-08-15 ENCOUNTER — Other Ambulatory Visit (HOSPITAL_COMMUNITY): Payer: Self-pay

## 2022-08-15 MED ORDER — SERTRALINE HCL 100 MG PO TABS
200.0000 mg | ORAL_TABLET | Freq: Every day | ORAL | 0 refills | Status: DC
  Filled 2022-08-15: qty 180, 90d supply, fill #0

## 2022-10-03 ENCOUNTER — Other Ambulatory Visit: Payer: Self-pay

## 2022-10-03 ENCOUNTER — Other Ambulatory Visit (HOSPITAL_COMMUNITY): Payer: Self-pay

## 2022-10-03 MED ORDER — LISINOPRIL-HYDROCHLOROTHIAZIDE 20-25 MG PO TABS
1.0000 | ORAL_TABLET | Freq: Every day | ORAL | 0 refills | Status: DC
  Filled 2022-10-03: qty 90, 90d supply, fill #0

## 2022-10-11 ENCOUNTER — Other Ambulatory Visit (HOSPITAL_COMMUNITY): Payer: Self-pay

## 2022-10-11 ENCOUNTER — Encounter (HOSPITAL_COMMUNITY): Payer: Self-pay

## 2022-10-11 ENCOUNTER — Emergency Department (HOSPITAL_COMMUNITY)
Admission: EM | Admit: 2022-10-11 | Discharge: 2022-10-11 | Disposition: A | Discharge status: Home / Self Care | Payer: Commercial Managed Care - PPO | Attending: Emergency Medicine | Admitting: Emergency Medicine

## 2022-10-11 ENCOUNTER — Other Ambulatory Visit: Payer: Self-pay

## 2022-10-11 DIAGNOSIS — E119 Type 2 diabetes mellitus without complications: Secondary | ICD-10-CM | POA: Insufficient documentation

## 2022-10-11 DIAGNOSIS — I10 Essential (primary) hypertension: Secondary | ICD-10-CM | POA: Insufficient documentation

## 2022-10-11 DIAGNOSIS — T7840XA Allergy, unspecified, initial encounter: Secondary | ICD-10-CM | POA: Diagnosis not present

## 2022-10-11 DIAGNOSIS — R000 Tachycardia, unspecified: Secondary | ICD-10-CM | POA: Insufficient documentation

## 2022-10-11 DIAGNOSIS — I2510 Atherosclerotic heart disease of native coronary artery without angina pectoris: Secondary | ICD-10-CM | POA: Diagnosis not present

## 2022-10-11 DIAGNOSIS — Z7984 Long term (current) use of oral hypoglycemic drugs: Secondary | ICD-10-CM | POA: Diagnosis not present

## 2022-10-11 DIAGNOSIS — L299 Pruritus, unspecified: Secondary | ICD-10-CM | POA: Insufficient documentation

## 2022-10-11 DIAGNOSIS — Z79899 Other long term (current) drug therapy: Secondary | ICD-10-CM | POA: Insufficient documentation

## 2022-10-11 MED ORDER — CETIRIZINE HCL 10 MG PO TABS
10.0000 mg | ORAL_TABLET | Freq: Two times a day (BID) | ORAL | 0 refills | Status: AC
  Filled 2022-10-11: qty 14, 7d supply, fill #0

## 2022-10-11 MED ORDER — EPINEPHRINE 0.3 MG/0.3ML IJ SOAJ
0.3000 mg | INTRAMUSCULAR | 0 refills | Status: AC | PRN
  Filled 2022-10-11: qty 2, 2d supply, fill #0

## 2022-10-11 MED ORDER — FAMOTIDINE 20 MG PO TABS
20.0000 mg | ORAL_TABLET | Freq: Two times a day (BID) | ORAL | 0 refills | Status: AC
  Filled 2022-10-11: qty 30, 15d supply, fill #0

## 2022-10-11 MED ORDER — METHYLPREDNISOLONE SODIUM SUCC 125 MG IJ SOLR
INTRAMUSCULAR | Status: AC
  Administered 2022-10-11: 125 mg via INTRAVENOUS
  Filled 2022-10-11: qty 2

## 2022-10-11 MED ORDER — HYDROXYZINE HCL 25 MG PO TABS
25.0000 mg | ORAL_TABLET | Freq: Once | ORAL | Status: AC
  Administered 2022-10-11: 25 mg via ORAL
  Filled 2022-10-11: qty 1

## 2022-10-11 MED ORDER — PREDNISONE 10 MG PO TABS
20.0000 mg | ORAL_TABLET | Freq: Every day | ORAL | 0 refills | Status: AC
  Filled 2022-10-11: qty 15, 7d supply, fill #0

## 2022-10-11 MED ORDER — SODIUM CHLORIDE 0.9 % IV BOLUS
1000.0000 mL | Freq: Once | INTRAVENOUS | Status: AC
  Administered 2022-10-11: 1000 mL via INTRAVENOUS

## 2022-10-11 MED ORDER — METHYLPREDNISOLONE SODIUM SUCC 125 MG IJ SOLR: 125.0000 mg | INTRAMUSCULAR | Status: AC

## 2022-10-11 MED ORDER — FAMOTIDINE IN NACL 20-0.9 MG/50ML-% IV SOLN
INTRAVENOUS | Status: AC
  Administered 2022-10-11: 20 mg
  Filled 2022-10-11: qty 50

## 2022-10-11 NOTE — ED Provider Notes (Signed)
[place] [facility] DE[facility]MENT AT [facility] Provider Note   CSN: [id number] Arrival date & time: 10/11/22  0103     History  Chief Complaint  Patient presents with   Allergic Reaction    [name] [name] is a 52 y.o. female.  The history is provided by the patient and a friend.  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 Context: cosmetics and food   Relieved by:  Antihistamines Worsened by:  Nothing Ineffective treatments:  Antihistamines Pleasant patient with diabetes and CAD with allergies presents with allergic reaction.  Partner had coconut shampoo as they were working.  No swelling of the lips, tongue or uvula.      Past Medical History:  Diagnosis Date   Coronary artery disease    Diabetes mellitus without complication (HCC)    Hypertension    Supraventricular arrhythmia      Home Medications Prior to Admission medications   Medication Sig Start Date End Date Taking? Authorizing Provider  cetirizine (ZYRTEC ALLERGY) 10 MG tablet Take 1 tablet (10 mg total) by mouth 2 (two) times daily. 10/11/22  Yes Caius Silbernagel, MD  EPINEPHrine 0.3 mg/0.3 mL IJ SOAJ injection Inject 0.3 mg into the muscle as needed for anaphylaxis. 10/11/22  Yes Darcus Edds, MD  famotidine (PEPCID) 20 MG tablet Take 1 tablet (20 mg total) by mouth 2 (two) times daily. 10/11/22  Yes Valinda Fedie, MD  predniSONE (DELTASONE) 10 MG tablet Take 2 tablets (20 mg total) by mouth daily. 10/11/22  Yes Christasia Angeletti, MD  albuterol (PROVENTIL HFA;VENTOLIN HFA) 108 (90 BASE) MCG/ACT inhaler Inhale 2 puffs into the lungs every 6 (six) hours as needed for wheezing or shortness of breath.    [provider]  Continuous Blood Gluc Receiver (DEXCOM G4 PLATINUM RECEIVER) DEVI Use as directed.  10/07/21     Continuous Blood Gluc Sensor (DEXCOM G5 MOB/G4 PLAT SENSOR) MISC Use as directed. 10/07/21     Continuous Glucose Sensor (DEXCOM G6 SENSOR) MISC Use as directed every 10 days 10/11/21   Encarnacion Slates, PA-C  Continuous Glucose Transmitter (DEXCOM G6 TRANSMITTER) MISC Use as directed every 90 days 06/04/22     COVID-19 At Home Antigen Test (CARESTART COVID-19 HOME TEST) KIT Use as directed. 08/26/20   Driscilla Grammes, RPH  glipiZIDE (GLUCOTROL) 10 MG tablet Take 1 tablet (10 mg total) by mouth daily for diabetes. 04/06/20   Leavy Cella, Amy H, PA-C  glipiZIDE (GLUCOTROL) 10 MG tablet TAKE 1 TABLET BY MOUTH ONCE DAILY FOR DIABETES 12/22/19 12/21/20  Juliette Alcide, MD  glipiZIDE (GLUCOTROL) 10 MG tablet Take 1/2 tablet (HALF A TABLET) by mouth daily for diabetes 04/21/22     glipiZIDE (GLUCOTROL) 10 MG tablet Take 0.5 tablets (5 mg total) by mouth daily for diabetes. 07/26/22     lisinopril-hydrochlorothiazide (PRINZIDE,ZESTORETIC) 20-25 MG per tablet Take 1 tablet by mouth daily.    [provider]  lisinopril-hydrochlorothiazide (ZESTORETIC) 20-25 MG tablet TAKE 1 TABLET BY MOUTH ONCE DAILY. 08/25/19 08/24/20  Leavy Cella, Amy H, PA-C  lisinopril-hydrochlorothiazide (ZESTORETIC) 20-25 MG tablet Take 1 tablet by mouth daily. 08/26/20     lisinopril-hydrochlorothiazide (ZESTORETIC) 20-25 MG tablet Take 1 tablet by mouth daily. 05/31/21     lisinopril-hydrochlorothiazide (ZESTORETIC) 20-25 MG tablet Take 1 tablet by mouth daily. 10/03/22     meperidine (  DEMEROL) 50 MG tablet Take 1 tablet (50 mg total) by mouth as needed for headache pain 11/29/20     metFORMIN (GLUCOPHAGE-XR) 500 MG 24 hr tablet TAKE 4 TABLETS BY MOUTH EVERY DAY 03/30/20 03/30/21  Leavy Cella, Amy H, PA-C  metFORMIN (GLUCOPHAGE-XR) 500 MG 24 hr tablet TAKE 4 TABLETS BY MOUTH ONCE DAILY. 08/25/19 08/24/20  Leavy Cella, Amy H, PA-C  metFORMIN (GLUCOPHAGE-XR) 500 MG 24 hr tablet Take 4 tablets (2,000 mg total) by mouth daily. 03/12/22     Multiple Vitamin  (MULTIVITAMIN WITH MINERALS) TABS tablet Take 1 tablet by mouth daily.    [provider]  ondansetron (ZOFRAN ODT) 4 MG disintegrating tablet Take 1 tablet (4 mg total) by mouth every 8 (eight) hours as needed for nausea or vomiting. 06/16/20   Burgess Amor, PA-C  ondansetron (ZOFRAN) 4 MG tablet Take 1 tablet (4 mg total) by mouth every 8 (eight) hours as needed for nausea or vomiting. 06/16/20   Burgess Amor, PA-C  ondansetron (ZOFRAN-ODT) 8 MG disintegrating tablet Dissolve 1 tablet (8 mg total) by mouth 3 (three) times daily as needed for nausea/vomiting 07/26/22     oxyCODONE-acetaminophen (ROXICET) 5-325 MG per tablet Take 1-2 tablets by mouth every 4 (four) hours as needed for severe pain. 10/01/13   Janne Napoleon, NP  promethazine (PHENERGAN) 12.5 MG tablet Take 1 tablet (12.5 mg total) by mouth every 6 (six) hours as needed for nausea or vomiting. 10/01/13   Janne Napoleon, NP  Rimegepant Sulfate (NURTEC) 75 MG TBDP Take 75 mg by mouth daily as needed for migraine 09/12/21     rosuvastatin (CRESTOR) 10 MG tablet Take 1 tablet (10 mg total) by mouth every evening. 03/22/22     rosuvastatin (CRESTOR) 5 MG tablet TAKE 1 TABLET BY MOUTH EVERY EVENING 01/26/20 01/25/21  Leavy Cella, Amy H, PA-C  rosuvastatin (CRESTOR) 5 MG tablet Take 1 tablet (5 mg total) by mouth every evening. 09/12/21     rosuvastatin (CRESTOR) 5 MG tablet Take 1 tablet (5 mg total) by mouth every evening. 03/12/22     rosuvastatin (CRESTOR) 5 MG tablet Take 1 tablet (5 mg total) by mouth every evening. 03/13/22     rosuvastatin (CRESTOR) 5 MG tablet Take 1 tablet (5 mg total) by mouth every evening. 07/26/22     Semaglutide, 1 MG/DOSE, (OZEMPIC, 1 MG/DOSE,) 4 MG/3ML SOPN INJECT 1MG  INTO THE SKIN ONCE WEEKLY 05/30/21     Semaglutide, 1 MG/DOSE, (OZEMPIC, 1 MG/DOSE,) 4 MG/3ML SOPN Inject 1 mg into the skin once a week. 09/12/21     Semaglutide, 2 MG/DOSE, (OZEMPIC, 2 MG/DOSE,) 8 MG/3ML SOPN Inject 2 mg into the skin once a week. 03/22/22      Semaglutide, 2 MG/DOSE, (OZEMPIC, 2 MG/DOSE,) 8 MG/3ML SOPN Inject 2 mg into the skin once a week. 07/26/22     sertraline (ZOLOFT) 100 MG tablet Take 100 mg by mouth daily.    [provider]  sertraline (ZOLOFT) 100 MG tablet Take 2 tablets (200 mg total) by mouth daily. 08/26/20     sertraline (ZOLOFT) 100 MG tablet Take 2 tablets (200 mg total) by mouth daily. 06/13/21     sertraline (ZOLOFT) 100 MG tablet Take 2 tablets (200 mg total) by mouth daily. 05/01/22     sertraline (ZOLOFT) 100 MG tablet Take 2 tablets (200 mg total) by mouth daily. 08/15/22         Allergies    Coconut oil, Codeine, and Orange oil    Review  of Systems   Review of Systems  Constitutional:  Negative for fever.  HENT:  Negative for trouble swallowing.        Throat itching   Eyes:  Negative for redness.  Respiratory:  Negative for wheezing.   Gastrointestinal:  Negative for anal bleeding, blood in stool and vomiting.  Skin:  Positive for itching.       Skin itching   All other systems reviewed and are negative.   Physical Exam Updated Vital Signs BP 119/84   Pulse (!) 110   Temp 98.5 F (36.9 C) (Oral)   Resp (!) 21   SpO2 98%  Physical Exam Vitals and nursing note reviewed.  Constitutional:      Appearance: Normal appearance. She is well-developed. She is not diaphoretic.  HENT:     Head: Normocephalic and atraumatic.     Nose: Nose normal.     Mouth/Throat:     Mouth: Mucous membranes are moist.     Pharynx: Oropharynx is clear.     Comments: No swelling of the lips tongue or uvula  Eyes:     Pupils: Pupils are equal, round, and reactive to light.  Cardiovascular:     Rate and Rhythm: Regular rhythm. Tachycardia present.     Pulses: Normal pulses.     Heart sounds: Normal heart sounds.  Pulmonary:     Effort: Pulmonary effort is normal. No respiratory distress.     Breath sounds: Normal breath sounds.  Abdominal:     General: Bowel sounds are normal. There is no distension.      Palpations: Abdomen is soft.     Tenderness: There is no abdominal tenderness. There is no guarding or rebound.  Musculoskeletal:        General: Normal range of motion.     Cervical back: Normal range of motion and neck supple.  Skin:    General: Skin is warm and dry.     Capillary Refill: Capillary refill takes less than 2 seconds.     Findings: No erythema or rash.  Neurological:     General: No focal deficit present.     Mental Status: She is alert.     Deep Tendon Reflexes: Reflexes normal.  Psychiatric:        Mood and Affect: Mood normal.        Behavior: Behavior normal.     ED Results / Procedures / Treatments   Labs (all labs ordered are listed, but only abnormal results are displayed) Labs Reviewed - No data to display  EKG None  Radiology No results found.  Procedures Procedures    Medications Ordered in ED Medications  methylPREDNISolone sodium succinate (SOLU-MEDROL) 125 mg/2 mL injection 125 mg (125 mg Intravenous Given 10/11/22 0116)  sodium chloride 0.9 % bolus 1,000 mL (1,000 mLs Intravenous New Bag/Given 10/11/22 0117)  famotidine (PEPCID) 20-0.9 MG/50ML-% IVPB (0 mg  Stopped 10/11/22 0125)  hydrOXYzine (ATARAX) tablet 25 mg (25 mg Oral Given 10/11/22 0158)    ED Course/ Medical Decision Making/ A&P                                 Medical Decision Making Patient had take 50 mg of benadryl prior to arrival   Problems Addressed: Allergic reaction, initial encounter: acute illness or injury    Details: Treated with IV pepcid as a secondary antihistamine and steroids and IV fluids.  Patient was observed in  the ED and reassessed frequently.  Patient PO challenged prior to discharge.    Amount and/or Complexity of Data Reviewed Independent Historian:     Details: Colleagues from work  External Data Reviewed: notes.    Details: Previous notes reviewed   Risk OTC drugs. Prescription drug management. Risk Details: Patient observed in the ED and  reassessed frequently.  No airway compromise.  No facial or lip swelling.  No hypotension.  Observed with improvement in heart rate.  PO challenged.  Stable for discharge.  RX for epi pen as needed, steroids, pepcid.  Patient to start ZYrtec twice daily in am.  Patient with referral to allergy for ongoing care.  Stable for discharge.  Strict return.      Final Clinical Impression(s) / ED Diagnoses Final diagnoses:  Allergic reaction, initial encounter    I have reviewed the triage vital signs and the nursing notes. Pertinent labs & imaging results that were available during my care of the patient were reviewed by me and considered in my medical decision making (see chart for details). After history, exam, and medical workup I feel the patient has been appropriately medically screened and is safe for discharge home. Pertinent diagnoses were discussed with the patient. Patient was given return precautions.    Rx / DC Orders ED Discharge Orders          Ordered    EPINEPHrine 0.3 mg/0.3 mL IJ SOAJ injection  As needed        10/11/22 0154    famotidine (PEPCID) 20 MG tablet  2 times daily        10/11/22 0154    cetirizine (ZYRTEC ALLERGY) 10 MG tablet  2 times daily        10/11/22 0154    predniSONE (DELTASONE) 10 MG tablet  Daily        10/11/22 0154              Creola Krotz, MD 10/11/22 5621

## 2022-10-11 NOTE — ED Triage Notes (Signed)
Patient is allergic to oranges and coconut, she believes her coworker exposed her to either coconut or oranges via hair products while working together. Her eyes started itching, throat itching

## 2022-10-11 NOTE — ED Notes (Signed)
Patient was provided with graham crackers & a soda per provider orders

## 2022-10-12 ENCOUNTER — Other Ambulatory Visit (HOSPITAL_COMMUNITY): Payer: Self-pay

## 2022-11-02 ENCOUNTER — Other Ambulatory Visit (HOSPITAL_COMMUNITY): Payer: Self-pay

## 2022-12-03 ENCOUNTER — Other Ambulatory Visit: Payer: Self-pay

## 2022-12-03 ENCOUNTER — Other Ambulatory Visit (HOSPITAL_COMMUNITY): Payer: Self-pay

## 2022-12-03 MED ORDER — GLIPIZIDE 10 MG PO TABS
5.0000 mg | ORAL_TABLET | Freq: Every day | ORAL | 0 refills | Status: DC
  Filled 2022-12-03: qty 45, 90d supply, fill #0

## 2023-01-02 ENCOUNTER — Other Ambulatory Visit (HOSPITAL_COMMUNITY): Payer: Self-pay

## 2023-01-02 ENCOUNTER — Other Ambulatory Visit: Payer: Self-pay

## 2023-01-02 MED ORDER — SERTRALINE HCL 100 MG PO TABS
200.0000 mg | ORAL_TABLET | Freq: Every day | ORAL | 0 refills | Status: DC
  Filled 2023-01-02 – 2023-01-07 (×2): qty 180, 90d supply, fill #0

## 2023-01-07 ENCOUNTER — Other Ambulatory Visit (HOSPITAL_COMMUNITY): Payer: Self-pay

## 2023-01-07 ENCOUNTER — Other Ambulatory Visit: Payer: Self-pay

## 2023-01-07 DIAGNOSIS — E782 Mixed hyperlipidemia: Secondary | ICD-10-CM | POA: Diagnosis not present

## 2023-01-07 DIAGNOSIS — F321 Major depressive disorder, single episode, moderate: Secondary | ICD-10-CM | POA: Diagnosis not present

## 2023-01-07 DIAGNOSIS — I10 Essential (primary) hypertension: Secondary | ICD-10-CM | POA: Diagnosis not present

## 2023-01-07 DIAGNOSIS — E119 Type 2 diabetes mellitus without complications: Secondary | ICD-10-CM | POA: Diagnosis not present

## 2023-01-07 DIAGNOSIS — G43909 Migraine, unspecified, not intractable, without status migrainosus: Secondary | ICD-10-CM | POA: Diagnosis not present

## 2023-01-08 ENCOUNTER — Other Ambulatory Visit: Payer: Self-pay

## 2023-01-08 ENCOUNTER — Other Ambulatory Visit (HOSPITAL_COMMUNITY): Payer: Self-pay

## 2023-01-08 MED ORDER — NURTEC 75 MG PO TBDP
ORAL_TABLET | ORAL | 0 refills | Status: DC
  Filled 2023-01-08: qty 8, 30d supply, fill #0

## 2023-01-08 MED ORDER — OZEMPIC (1 MG/DOSE) 4 MG/3ML ~~LOC~~ SOPN
1.0000 mg | PEN_INJECTOR | SUBCUTANEOUS | 3 refills | Status: AC
  Filled 2023-01-08: qty 3, 28d supply, fill #0

## 2023-01-10 ENCOUNTER — Other Ambulatory Visit: Payer: Self-pay

## 2023-01-14 ENCOUNTER — Other Ambulatory Visit: Payer: Self-pay

## 2023-02-06 ENCOUNTER — Other Ambulatory Visit (HOSPITAL_COMMUNITY): Payer: Self-pay

## 2023-02-06 ENCOUNTER — Other Ambulatory Visit: Payer: Self-pay

## 2023-02-06 MED ORDER — LISINOPRIL-HYDROCHLOROTHIAZIDE 20-25 MG PO TABS
1.0000 | ORAL_TABLET | Freq: Every day | ORAL | 0 refills | Status: DC
  Filled 2023-02-06: qty 90, 90d supply, fill #0

## 2023-03-05 ENCOUNTER — Other Ambulatory Visit (HOSPITAL_COMMUNITY): Payer: Self-pay

## 2023-04-04 ENCOUNTER — Other Ambulatory Visit: Payer: Self-pay

## 2023-04-04 ENCOUNTER — Other Ambulatory Visit (HOSPITAL_COMMUNITY): Payer: Self-pay

## 2023-04-04 MED ORDER — GLIPIZIDE 10 MG PO TABS
5.0000 mg | ORAL_TABLET | Freq: Every day | ORAL | 0 refills | Status: DC
  Filled 2023-04-04 (×2): qty 45, 90d supply, fill #0

## 2023-04-04 MED ORDER — ROSUVASTATIN CALCIUM 5 MG PO TABS
5.0000 mg | ORAL_TABLET | Freq: Every evening | ORAL | 0 refills | Status: DC
  Filled 2023-04-04: qty 90, 90d supply, fill #0

## 2023-04-04 MED ORDER — ONDANSETRON 8 MG PO TBDP
8.0000 mg | ORAL_TABLET | Freq: Three times a day (TID) | ORAL | 1 refills | Status: AC | PRN
  Filled 2023-04-04 (×2): qty 30, 10d supply, fill #0
  Filled 2023-05-30: qty 30, 10d supply, fill #1

## 2023-04-20 ENCOUNTER — Other Ambulatory Visit (HOSPITAL_COMMUNITY): Payer: Self-pay

## 2023-05-30 ENCOUNTER — Other Ambulatory Visit (HOSPITAL_COMMUNITY): Payer: Self-pay

## 2023-06-03 ENCOUNTER — Other Ambulatory Visit (HOSPITAL_COMMUNITY): Payer: Self-pay

## 2023-06-03 MED ORDER — SERTRALINE HCL 100 MG PO TABS
200.0000 mg | ORAL_TABLET | Freq: Every day | ORAL | 0 refills | Status: DC
  Filled 2023-06-03: qty 180, 90d supply, fill #0

## 2023-06-04 ENCOUNTER — Other Ambulatory Visit (HOSPITAL_COMMUNITY): Payer: Self-pay

## 2023-06-05 ENCOUNTER — Other Ambulatory Visit (HOSPITAL_COMMUNITY): Payer: Self-pay

## 2023-06-20 ENCOUNTER — Other Ambulatory Visit (HOSPITAL_COMMUNITY): Payer: Self-pay

## 2023-06-20 MED ORDER — LISINOPRIL-HYDROCHLOROTHIAZIDE 20-25 MG PO TABS
1.0000 | ORAL_TABLET | Freq: Every day | ORAL | 0 refills | Status: DC
  Filled 2023-06-20 – 2023-06-24 (×2): qty 90, 90d supply, fill #0

## 2023-06-24 ENCOUNTER — Other Ambulatory Visit (HOSPITAL_COMMUNITY): Payer: Self-pay

## 2023-06-24 ENCOUNTER — Encounter (HOSPITAL_COMMUNITY): Payer: Self-pay

## 2023-06-24 ENCOUNTER — Other Ambulatory Visit: Payer: Self-pay

## 2023-06-24 DIAGNOSIS — Z0000 Encounter for general adult medical examination without abnormal findings: Secondary | ICD-10-CM | POA: Diagnosis not present

## 2023-06-24 DIAGNOSIS — E1165 Type 2 diabetes mellitus with hyperglycemia: Secondary | ICD-10-CM | POA: Diagnosis not present

## 2023-06-24 DIAGNOSIS — I10 Essential (primary) hypertension: Secondary | ICD-10-CM | POA: Diagnosis not present

## 2023-06-24 MED ORDER — JARDIANCE 10 MG PO TABS
10.0000 mg | ORAL_TABLET | Freq: Every day | ORAL | 0 refills | Status: DC
  Filled 2023-06-24: qty 90, 90d supply, fill #0

## 2023-06-26 ENCOUNTER — Other Ambulatory Visit (HOSPITAL_COMMUNITY): Payer: Self-pay

## 2023-07-15 ENCOUNTER — Ambulatory Visit: Admitting: Nutrition

## 2023-07-15 ENCOUNTER — Telehealth: Payer: Self-pay | Admitting: Nutrition

## 2023-07-15 NOTE — Telephone Encounter (Signed)
VM to call and r/s missed appt.

## 2023-07-31 ENCOUNTER — Other Ambulatory Visit (HOSPITAL_COMMUNITY): Payer: Self-pay

## 2023-08-04 MED ORDER — METFORMIN HCL ER 500 MG PO TB24
2000.0000 mg | ORAL_TABLET | Freq: Every day | ORAL | 3 refills | Status: AC
  Filled 2023-08-04: qty 360, 90d supply, fill #0
  Filled 2023-12-26: qty 360, 90d supply, fill #1

## 2023-08-05 ENCOUNTER — Other Ambulatory Visit (HOSPITAL_COMMUNITY): Payer: Self-pay

## 2023-08-05 ENCOUNTER — Other Ambulatory Visit: Payer: Self-pay

## 2023-09-06 ENCOUNTER — Other Ambulatory Visit (HOSPITAL_COMMUNITY): Payer: Self-pay

## 2023-09-07 ENCOUNTER — Other Ambulatory Visit (HOSPITAL_COMMUNITY): Payer: Self-pay

## 2023-09-07 ENCOUNTER — Other Ambulatory Visit: Payer: Self-pay

## 2023-09-07 MED ORDER — GLIPIZIDE 10 MG PO TABS
5.0000 mg | ORAL_TABLET | Freq: Every day | ORAL | 2 refills | Status: AC
  Filled 2023-09-07: qty 45, 90d supply, fill #0
  Filled 2024-02-03: qty 45, 90d supply, fill #1

## 2023-09-12 ENCOUNTER — Other Ambulatory Visit (HOSPITAL_COMMUNITY): Payer: Self-pay

## 2023-09-23 ENCOUNTER — Other Ambulatory Visit: Payer: Self-pay

## 2023-09-23 ENCOUNTER — Other Ambulatory Visit (HOSPITAL_COMMUNITY): Payer: Self-pay

## 2023-09-24 ENCOUNTER — Other Ambulatory Visit (HOSPITAL_COMMUNITY): Payer: Self-pay

## 2023-09-24 ENCOUNTER — Other Ambulatory Visit: Payer: Self-pay

## 2023-09-24 MED ORDER — SERTRALINE HCL 100 MG PO TABS
200.0000 mg | ORAL_TABLET | Freq: Every day | ORAL | 0 refills | Status: DC
  Filled 2023-09-24: qty 180, 90d supply, fill #0

## 2023-09-24 MED ORDER — ROSUVASTATIN CALCIUM 5 MG PO TABS
5.0000 mg | ORAL_TABLET | Freq: Every evening | ORAL | 0 refills | Status: DC
  Filled 2023-09-24: qty 90, 90d supply, fill #0

## 2023-10-16 ENCOUNTER — Other Ambulatory Visit (HOSPITAL_COMMUNITY): Payer: Self-pay

## 2023-10-16 MED ORDER — EMPAGLIFLOZIN 10 MG PO TABS
10.0000 mg | ORAL_TABLET | Freq: Every day | ORAL | 0 refills | Status: AC
  Filled 2023-10-16 – 2023-10-21 (×2): qty 90, 90d supply, fill #0

## 2023-10-16 MED ORDER — LISINOPRIL-HYDROCHLOROTHIAZIDE 20-25 MG PO TABS
1.0000 | ORAL_TABLET | Freq: Every day | ORAL | 0 refills | Status: DC
  Filled 2023-10-16 – 2023-10-21 (×2): qty 90, 90d supply, fill #0

## 2023-10-16 MED ORDER — RIMEGEPANT SULFATE 75 MG PO TBDP
ORAL_TABLET | ORAL | 0 refills | Status: AC
  Filled 2023-10-16: qty 8, 8d supply, fill #0
  Filled 2024-02-03: qty 8, 30d supply, fill #0

## 2023-10-21 ENCOUNTER — Other Ambulatory Visit (HOSPITAL_COMMUNITY): Payer: Self-pay

## 2023-10-22 ENCOUNTER — Other Ambulatory Visit (HOSPITAL_COMMUNITY): Payer: Self-pay

## 2023-11-06 ENCOUNTER — Other Ambulatory Visit (HOSPITAL_COMMUNITY): Payer: Self-pay

## 2023-11-06 MED ORDER — FLUCONAZOLE 150 MG PO TABS
150.0000 mg | ORAL_TABLET | Freq: Once | ORAL | 0 refills | Status: AC
  Filled 2023-11-06: qty 1, 1d supply, fill #0

## 2023-11-07 ENCOUNTER — Other Ambulatory Visit: Payer: Self-pay

## 2023-12-26 ENCOUNTER — Other Ambulatory Visit (HOSPITAL_COMMUNITY): Payer: Self-pay

## 2023-12-26 ENCOUNTER — Other Ambulatory Visit: Payer: Self-pay

## 2024-02-03 ENCOUNTER — Other Ambulatory Visit (HOSPITAL_COMMUNITY): Payer: Self-pay

## 2024-02-04 ENCOUNTER — Other Ambulatory Visit: Payer: Self-pay

## 2024-02-04 ENCOUNTER — Other Ambulatory Visit (HOSPITAL_COMMUNITY): Payer: Self-pay

## 2024-02-04 MED ORDER — SERTRALINE HCL 100 MG PO TABS
200.0000 mg | ORAL_TABLET | Freq: Every day | ORAL | 0 refills | Status: AC
  Filled 2024-02-04: qty 180, 90d supply, fill #0

## 2024-02-04 MED ORDER — ROSUVASTATIN CALCIUM 5 MG PO TABS
5.0000 mg | ORAL_TABLET | Freq: Every evening | ORAL | 0 refills | Status: AC
  Filled 2024-02-04: qty 90, 90d supply, fill #0

## 2024-02-04 MED ORDER — LISINOPRIL-HYDROCHLOROTHIAZIDE 20-25 MG PO TABS
1.0000 | ORAL_TABLET | Freq: Every day | ORAL | 0 refills | Status: AC
  Filled 2024-02-04: qty 90, 90d supply, fill #0

## 2024-02-07 ENCOUNTER — Other Ambulatory Visit: Payer: Self-pay
